# Patient Record
Sex: Male | Born: 1975 | Race: White | Hispanic: No | Marital: Married | Smoking: Current every day smoker
Health system: Southern US, Community
[De-identification: ages and names within clinical notes are randomized; demographics above are authoritative.]

## PROBLEM LIST (undated history)

## (undated) DIAGNOSIS — G4730 Sleep apnea, unspecified: Secondary | ICD-10-CM

## (undated) DIAGNOSIS — K219 Gastro-esophageal reflux disease without esophagitis: Secondary | ICD-10-CM

## (undated) HISTORY — DX: Gastro-esophageal reflux disease without esophagitis: K21.9

## (undated) HISTORY — DX: Sleep apnea, unspecified: G47.30

---

## 1998-07-16 HISTORY — PX: WISDOM TOOTH EXTRACTION: SHX21

## 2018-07-16 DIAGNOSIS — K922 Gastrointestinal hemorrhage, unspecified: Secondary | ICD-10-CM

## 2018-07-16 HISTORY — DX: Gastrointestinal hemorrhage, unspecified: K92.2

## 2020-05-20 ENCOUNTER — Other Ambulatory Visit: Payer: Self-pay | Admitting: Family Medicine

## 2020-05-20 ENCOUNTER — Ambulatory Visit
Admission: RE | Admit: 2020-05-20 | Discharge: 2020-05-20 | Disposition: A | Discharge status: Home / Self Care | Payer: BC Managed Care – PPO | Attending: Family Medicine | Admitting: Family Medicine

## 2020-05-20 ENCOUNTER — Other Ambulatory Visit: Payer: Self-pay

## 2020-05-20 ENCOUNTER — Ambulatory Visit
Admission: RE | Admit: 2020-05-20 | Discharge: 2020-05-20 | Disposition: A | Discharge status: Home / Self Care | Payer: BC Managed Care – PPO | Source: Ambulatory Visit | Attending: Family Medicine | Admitting: Family Medicine

## 2020-05-20 DIAGNOSIS — M545 Low back pain, unspecified: Secondary | ICD-10-CM | POA: Insufficient documentation

## 2020-05-20 DIAGNOSIS — M546 Pain in thoracic spine: Secondary | ICD-10-CM

## 2020-06-17 ENCOUNTER — Encounter: Payer: Self-pay | Admitting: Urology

## 2020-06-17 ENCOUNTER — Other Ambulatory Visit: Payer: Self-pay

## 2020-06-17 ENCOUNTER — Ambulatory Visit: Payer: BC Managed Care – PPO | Admitting: Urology

## 2020-06-17 VITALS — BP 137/84 | HR 73 | Ht 68.0 in | Wt 230.0 lb

## 2020-06-17 DIAGNOSIS — N5201 Erectile dysfunction due to arterial insufficiency: Secondary | ICD-10-CM | POA: Diagnosis not present

## 2020-06-17 DIAGNOSIS — R6882 Decreased libido: Secondary | ICD-10-CM | POA: Diagnosis not present

## 2020-06-17 MED ORDER — TADALAFIL 20 MG PO TABS: ORAL_TABLET | ORAL | 0 refills | Status: DC

## 2020-06-17 NOTE — Progress Notes (Signed)
   06/17/2020 8:43 AM   Gabriel Young 1975/11/06 [id number]  Referring provider: Alveta Heimlich, FNP [address] [place],  [place] [address]  Chief Complaint  Patient presents with  . Erectile Dysfunction    HPI: Gabriel Young is a 44 y.o. male seen at request of Collie Siad, FNP for evaluation of erectile dysfunction.   5 year history of the ED  States symptoms started after being placed on Lexapro for depression 6 years ago; after taking medication for several months he had loss of interest in sex and had erectile dysfunction  Has been off the medication for 3 years and currently not on any SSRI meds  Has no interest in sexual activity  Has partial erections which are not firm enough for penetration and difficulty maintaining  No prior evaluation or treatment  No pain or curvature with erections  No significant erections with early morning awakening or middle of the night  Risk factors-current smoker 1 pack/day x 20 years  PMH: History reviewed. No pertinent past medical history.  Surgical History: History reviewed. No pertinent surgical history.  Home Medications:  Allergies as of 06/17/2020      Reactions   Penicillins Other (See Comments)      Medication List       Accurate as of June 17, 2020  8:43 AM. If you have any questions, ask your nurse or doctor.        diclofenac 75 MG EC tablet Commonly known as: VOLTAREN diclofenac sodium 75 mg tablet,delayed release  Take 1 tablet twice a day by oral route with meals for 15 days.   gabapentin 400 MG capsule Commonly known as: NEURONTIN gabapentin 400 mg capsule  Take 1 capsule every day by oral route at bedtime for 15 days.   Lidoderm 5 % Generic drug: lidocaine Lidoderm 5 % topical patch  APPLY 1 PATCH BY TOPICAL ROUTE ONCE DAILY (MAY WEAR UP TO 12HOURS.)       Allergies:  Allergies  Allergen Reactions  . Penicillins Other (See Comments)    Family History: History reviewed. No pertinent  family history.  Social History:  reports that he has been smoking. He has never used smokeless tobacco. He reports current alcohol use. No history on file for drug use.   Physical Exam: BP 137/84   Pulse 73   Ht 5\' 8"  (1.727 m)   Wt 230 lb (104.3 kg)   BMI 34.97 kg/m   Constitutional:  Alert and oriented, No acute distress. HEENT: Rachel AT, moist mucus membranes.  Trachea midline, no masses. Cardiovascular: No clubbing, cyanosis, or edema. Respiratory: Normal respiratory effort, no increased work of breathing. GI: Abdomen is soft, nontender, nondistended, no abdominal masses GU: Phallus circumcised without lesions, testes descended bilateral without masses or tenderness; estimated volume 15 cc bilaterally Skin: No rashes, bruises or suspicious lesions. Neurologic: Grossly intact, no focal deficits, moving all 4 extremities. Psychiatric: Normal mood and affect.   Assessment & Plan:    1.  Erectile dysfunction  Severe ED  Tobacco history most significant organic risk factor  Trial tadalafil 20 mg 1 hour prior to intercourse; instructed to try on at least 5 occasions before determining failure  2.  Low libido  Testosterone/LH drawn this morning  Will call with results   , MD  [facility] [address] [place], [place] [phone]

## 2020-06-18 LAB — LUTEINIZING HORMONE: LH: 5.8 m[IU]/mL (ref 1.7–8.6)

## 2020-06-18 LAB — TESTOSTERONE: Testosterone: 433 ng/dL (ref 264–916)

## 2020-06-20 ENCOUNTER — Telehealth: Payer: Self-pay | Admitting: *Deleted

## 2020-06-20 NOTE — Telephone Encounter (Signed)
-----   Message from Riki Altes, MD sent at 06/19/2020 10:57 AM EST ----- Testosterone level was normal

## 2020-06-20 NOTE — Telephone Encounter (Signed)
Notified patient as instructed, patient pleased °

## 2020-07-29 DIAGNOSIS — G5603 Carpal tunnel syndrome, bilateral upper limbs: Secondary | ICD-10-CM | POA: Diagnosis not present

## 2020-07-29 DIAGNOSIS — M545 Low back pain, unspecified: Secondary | ICD-10-CM | POA: Diagnosis not present

## 2020-08-13 DIAGNOSIS — M545 Low back pain, unspecified: Secondary | ICD-10-CM | POA: Diagnosis not present

## 2020-08-31 DIAGNOSIS — M545 Low back pain, unspecified: Secondary | ICD-10-CM | POA: Diagnosis not present

## 2020-09-02 DIAGNOSIS — M5416 Radiculopathy, lumbar region: Secondary | ICD-10-CM | POA: Diagnosis not present

## 2020-09-23 DIAGNOSIS — R252 Cramp and spasm: Secondary | ICD-10-CM | POA: Diagnosis not present

## 2020-09-23 DIAGNOSIS — M545 Low back pain, unspecified: Secondary | ICD-10-CM | POA: Diagnosis not present

## 2020-09-23 DIAGNOSIS — D72829 Elevated white blood cell count, unspecified: Secondary | ICD-10-CM | POA: Diagnosis not present

## 2020-09-23 DIAGNOSIS — G8929 Other chronic pain: Secondary | ICD-10-CM | POA: Diagnosis not present

## 2020-09-30 DIAGNOSIS — G8929 Other chronic pain: Secondary | ICD-10-CM | POA: Diagnosis not present

## 2020-09-30 DIAGNOSIS — M545 Low back pain, unspecified: Secondary | ICD-10-CM | POA: Diagnosis not present

## 2020-09-30 DIAGNOSIS — D72829 Elevated white blood cell count, unspecified: Secondary | ICD-10-CM | POA: Diagnosis not present

## 2020-09-30 DIAGNOSIS — R252 Cramp and spasm: Secondary | ICD-10-CM | POA: Diagnosis not present

## 2020-10-21 ENCOUNTER — Other Ambulatory Visit: Payer: Self-pay

## 2020-10-21 ENCOUNTER — Ambulatory Visit: Payer: BC Managed Care – PPO | Admitting: Infectious Disease

## 2020-10-21 ENCOUNTER — Encounter: Payer: Self-pay | Admitting: Infectious Disease

## 2020-10-21 VITALS — BP 135/75 | HR 73 | Temp 97.9°F | Wt 231.0 lb

## 2020-10-21 DIAGNOSIS — Z114 Encounter for screening for human immunodeficiency virus [HIV]: Secondary | ICD-10-CM

## 2020-10-21 DIAGNOSIS — R252 Cramp and spasm: Secondary | ICD-10-CM

## 2020-10-21 DIAGNOSIS — M791 Myalgia, unspecified site: Secondary | ICD-10-CM

## 2020-10-21 DIAGNOSIS — B2790 Infectious mononucleosis, unspecified without complication: Secondary | ICD-10-CM | POA: Diagnosis not present

## 2020-10-21 DIAGNOSIS — K922 Gastrointestinal hemorrhage, unspecified: Secondary | ICD-10-CM

## 2020-10-21 DIAGNOSIS — K274 Chronic or unspecified peptic ulcer, site unspecified, with hemorrhage: Secondary | ICD-10-CM

## 2020-10-21 DIAGNOSIS — M5442 Lumbago with sciatica, left side: Secondary | ICD-10-CM

## 2020-10-21 DIAGNOSIS — N529 Male erectile dysfunction, unspecified: Secondary | ICD-10-CM

## 2020-10-21 DIAGNOSIS — F17200 Nicotine dependence, unspecified, uncomplicated: Secondary | ICD-10-CM

## 2020-10-21 DIAGNOSIS — M5441 Lumbago with sciatica, right side: Secondary | ICD-10-CM

## 2020-10-21 DIAGNOSIS — M62838 Other muscle spasm: Secondary | ICD-10-CM

## 2020-10-21 DIAGNOSIS — M545 Low back pain, unspecified: Secondary | ICD-10-CM

## 2020-10-21 DIAGNOSIS — G8929 Other chronic pain: Secondary | ICD-10-CM

## 2020-10-21 HISTORY — DX: Other muscle spasm: M62.838

## 2020-10-21 HISTORY — DX: Low back pain, unspecified: M54.50

## 2020-10-21 HISTORY — DX: Cramp and spasm: R25.2

## 2020-10-21 HISTORY — DX: Gastrointestinal hemorrhage, unspecified: K92.2

## 2020-10-21 HISTORY — DX: Male erectile dysfunction, unspecified: N52.9

## 2020-10-21 HISTORY — DX: Infectious mononucleosis, unspecified without complication: B27.90

## 2020-10-21 HISTORY — DX: Myalgia, unspecified site: M79.10

## 2020-10-21 HISTORY — DX: Nicotine dependence, unspecified, uncomplicated: F17.200

## 2020-10-21 MED ORDER — PREDNISONE 20 MG PO TABS: 40.0000 mg | ORAL_TABLET | Freq: Every day | ORAL | 0 refills | Status: AC

## 2020-10-21 NOTE — Progress Notes (Signed)
Subjective:  Reason for infectious disease consult: Positive Epstein-Barr viral capsid IgG  Referring Physician: Collie Siad, NP   Patient ID: Gabriel Young, male    DOB: [birth year], 45 y.o.   MRN: [id number] Chief complaint: diffuse muscle pains HPI  Gabriel Young is a 45 year old previously had been largely healthy other than history of smoking who this past year has developed severe pain in his upper and mid and low back as well as in multiple muscles throughout his body along with severe cramping.  He has been followed at the [place] (where he works[facility] by Collie Siad NP.   Back pain but has been evaluated by plain films and an MRI and he has been evaluated by orthopedics with [facility] and also had physical therapy but he had difficulty participating in physical therapy due to the severity of his muscle cramps.  His pain and cramping is not responded to symptomatic treatment so far.  He was given low-dose prednisone 10 mg without much relief.  He has seen my former partner Dr. Sampson Goon at the [facility] as well.  His primary care physician ordered an Epstein-Barr virus antibody on him.  I believe this may have been to work-up possible myositis.  The patient himself tells me that there was concerned about ultimately sclerosis.  He does also endorse some problems with his vision.  Has had hep B and hep C antibodies tested there were also negative but not HIV testing.  I told him that having a high IgG level to Epstein-Barr was not a concern but would be more consistent with prior infection with this virus that stays with [place] for our entire lives. The level of this antibody does not at all indicate recent infection.       No past medical history on file.  No past surgical history on file.  No family history on file.    Social History   Socioeconomic History  . Marital status: Single    Spouse name: Not on file  . Number of children: Not on  file  . Years of education: Not on file  . Highest education level: Not on file  Occupational History  . Not on file  Tobacco Use  . Smoking status: Current Every Day Smoker  . Smokeless tobacco: Never Used  Substance and Sexual Activity  . Alcohol use: Yes  . Drug use: Not on file  . Sexual activity: Not on file  Other Topics Concern  . Not on file  Social History Narrative  . Not on file   Social Determinants of Health   Financial Resource Strain: Not on file  Food Insecurity: Not on file  Transportation Needs: Not on file  Physical Activity: Not on file  Stress: Not on file  Social Connections: Not on file    Allergies  Allergen Reactions  . Penicillins Other (See Comments)     Current Outpatient Medications:  .  diclofenac (VOLTAREN) 75 MG EC tablet, diclofenac sodium 75 mg tablet,delayed release  Take 1 tablet twice a day by oral route with meals for 15 days., Disp: , Rfl:  .  gabapentin (NEURONTIN) 400 MG capsule, gabapentin 400 mg capsule  Take 1 capsule every day by oral route at bedtime for 15 days., Disp: , Rfl:  .  lidocaine (LIDODERM) 5 %, Lidoderm 5 % topical patch  APPLY 1 PATCH BY TOPICAL ROUTE ONCE DAILY (MAY WEAR UP TO 12HOURS.), Disp: , Rfl:  .  tadalafil (CIALIS)  20 MG tablet, 1 tab 1 hour prior to intercourse, Disp: 10 tablet, Rfl: 0  Review of Systems  Constitutional: Positive for fatigue. Negative for activity change, appetite change, chills, diaphoresis, fever and unexpected weight change.  HENT: Negative for congestion, rhinorrhea, sinus pressure, sneezing, sore throat and trouble swallowing.   Eyes: Negative for photophobia and visual disturbance.  Respiratory: Negative for cough, chest tightness, shortness of breath, wheezing and stridor.   Cardiovascular: Negative for chest pain, palpitations and leg swelling.  Gastrointestinal: Negative for abdominal distention, abdominal pain, anal bleeding, blood in stool, constipation, diarrhea, nausea and  vomiting.  Genitourinary: Negative for difficulty urinating, dysuria, flank pain and hematuria.  Musculoskeletal: Positive for arthralgias, back pain and myalgias. Negative for gait problem and joint swelling.  Skin: Negative for color change, pallor, rash and wound.  Neurological: Negative for dizziness, tremors, weakness and light-headedness.  Hematological: Negative for adenopathy. Does not bruise/bleed easily.  Psychiatric/Behavioral: Positive for dysphoric mood. Negative for agitation, behavioral problems, confusion, decreased concentration and sleep disturbance.       Objective:   Physical Exam Constitutional:      Appearance: He is well-developed.  HENT:     Head: Normocephalic and atraumatic.  Eyes:     Conjunctiva/sclera: Conjunctivae normal.  Cardiovascular:     Rate and Rhythm: Normal rate and regular rhythm.  Pulmonary:     Effort: Pulmonary effort is normal. No respiratory distress.     Breath sounds: No wheezing.  Abdominal:     General: There is no distension.     Palpations: Abdomen is soft.  Musculoskeletal:        General: Normal range of motion.     Right upper arm: Tenderness present.     Left upper arm: Tenderness present.     Right forearm: Tenderness present.     Left forearm: Tenderness present.     Cervical back: Normal range of motion and neck supple. Spasms, tenderness and bony tenderness present. No swelling, edema or deformity. Normal range of motion.     Thoracic back: Tenderness and bony tenderness present. No swelling, edema or signs of trauma.     Lumbar back: Tenderness present. No swelling, edema, deformity or lacerations. Normal range of motion.     Right upper leg: Tenderness present.     Left upper leg: Tenderness present.  Skin:    General: Skin is warm and dry.     Coloration: Skin is not pale.     Findings: No erythema or rash.  Neurological:     Mental Status: He is alert and oriented to person, place, and time.            Assessment & Plan:   Diffuse muscle pains including upper back lower back arms legs:  Do not think this is an infectious myositis his hep B and hep C antibodies were negative we will check for HIV.  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  Will check CMV IgG and IgM though I doubt very much that he has active CMV infection either.  I will send labs for autoimmune myositis including ANA rheumatoid factor aldolase LDH ANCA   I have also recommended [facility] for him  I will give him a 10-day course of for prednisone 40 mg to see if this provides any relief.  He did have a history of an upper GI bleed many years ago and I did inform him that there was some risk of precipitating GI bleed  with a corticosteroid but he was willing to go forward with a trial of steroids.    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 I did not see much point in checking a full antibody panel for this as this seems highly unlikely but we could have out of thoroughness and certainly a full antibody panel could be done by another provider, my vision for viral myositis is next to 0.  Visual problems should consider referral to ophthalmology  I spent greater than 60 minutes with the patient including greater than 50% of time in face to face counsel of the patient work-up of the patient's myositis significance of his Epstein-Barr virus antibody reviewing medical records including pertinent laboratory and radiographic data which I personally reviewed including that found in our own EMR and in coordination of his care

## 2020-10-25 LAB — ANTI-NUCLEAR AB-TITER (ANA TITER): ANA Titer 1: 1:40 {titer} — ABNORMAL HIGH

## 2020-10-25 LAB — CMV IGM: CMV IgM: <30 AU/mL

## 2020-10-25 LAB — CK: Total CK: 132 U/L (ref 44–196)

## 2020-10-25 LAB — PAN-ANCA
ANCA Screen: NEGATIVE
Myeloperoxidase Abs: <1 AI
Serine Protease 3: <1 AI

## 2020-10-25 LAB — C-REACTIVE PROTEIN: CRP: 2 mg/L (ref <8.0)

## 2020-10-25 LAB — CYTOMEGALOVIRUS ANTIBODY, IGG: Cytomegalovirus Ab-IgG: <0.6 U/mL

## 2020-10-25 LAB — SEDIMENTATION RATE: Sed Rate: 11 mm/h (ref 0–15)

## 2020-10-25 LAB — HIV ANTIBODY (ROUTINE TESTING W REFLEX): HIV 1&2 Ab, 4th Generation: NONREACTIVE

## 2020-10-25 LAB — ANA: Anti Nuclear Antibody (ANA): POSITIVE — AB

## 2020-10-25 LAB — RHEUMATOID FACTOR: Rheumatoid fact SerPl-aCnc: <14 IU/mL (ref <14)

## 2020-10-25 LAB — ALDOLASE: Aldolase: 5.7 U/L (ref <=8.1)

## 2020-10-25 LAB — TSH: TSH: 1.09 mIU/L (ref 0.40–4.50)

## 2020-11-04 DIAGNOSIS — R768 Other specified abnormal immunological findings in serum: Secondary | ICD-10-CM | POA: Diagnosis not present

## 2020-11-04 DIAGNOSIS — M791 Myalgia, unspecified site: Secondary | ICD-10-CM | POA: Diagnosis not present

## 2020-11-04 DIAGNOSIS — R252 Cramp and spasm: Secondary | ICD-10-CM | POA: Diagnosis not present

## 2020-11-04 DIAGNOSIS — M359 Systemic involvement of connective tissue, unspecified: Secondary | ICD-10-CM | POA: Diagnosis not present

## 2020-11-11 DIAGNOSIS — M5416 Radiculopathy, lumbar region: Secondary | ICD-10-CM | POA: Diagnosis not present

## 2020-11-25 ENCOUNTER — Telehealth: Payer: Self-pay

## 2020-11-25 DIAGNOSIS — M5416 Radiculopathy, lumbar region: Secondary | ICD-10-CM | POA: Diagnosis not present

## 2020-11-25 DIAGNOSIS — M4316 Spondylolisthesis, lumbar region: Secondary | ICD-10-CM | POA: Diagnosis not present

## 2020-11-25 DIAGNOSIS — Z6833 Body mass index (BMI) 33.0-33.9, adult: Secondary | ICD-10-CM | POA: Diagnosis not present

## 2020-11-25 DIAGNOSIS — R030 Elevated blood-pressure reading, without diagnosis of hypertension: Secondary | ICD-10-CM | POA: Diagnosis not present

## 2020-11-25 NOTE — Telephone Encounter (Signed)
Yes thanks Aundra Millet we never identified an infectious disease in this gentleman as far as a cause of his symptoms

## 2020-11-25 NOTE — Telephone Encounter (Signed)
Called patient, advised him no infectious cause was identified and encouraged him to follow up with rheumatology. Patient verbalized understanding and has no further questions.   Sandie Ano, RN

## 2020-11-25 NOTE — Telephone Encounter (Signed)
Patient called concerned about reactive ANA test. RN advised him to follow up with rheumatology regarding this lab result. He states he saw rheumatology but that they "did not do anything" for him. He complains of more leg cramping and is wondering if he needs further follow up with infectious disease, states he will again reach out to rheumatology.   Sandie Ano, RN

## 2021-01-06 DIAGNOSIS — G8929 Other chronic pain: Secondary | ICD-10-CM | POA: Diagnosis not present

## 2021-01-06 DIAGNOSIS — D72829 Elevated white blood cell count, unspecified: Secondary | ICD-10-CM | POA: Diagnosis not present

## 2021-01-06 DIAGNOSIS — R252 Cramp and spasm: Secondary | ICD-10-CM | POA: Diagnosis not present

## 2021-01-06 DIAGNOSIS — G894 Chronic pain syndrome: Secondary | ICD-10-CM | POA: Diagnosis not present

## 2021-01-06 DIAGNOSIS — M545 Low back pain, unspecified: Secondary | ICD-10-CM | POA: Diagnosis not present

## 2021-04-07 DIAGNOSIS — Z0000 Encounter for general adult medical examination without abnormal findings: Secondary | ICD-10-CM | POA: Diagnosis not present

## 2021-04-07 DIAGNOSIS — R0683 Snoring: Secondary | ICD-10-CM | POA: Diagnosis not present

## 2021-04-07 DIAGNOSIS — G894 Chronic pain syndrome: Secondary | ICD-10-CM | POA: Diagnosis not present

## 2021-04-07 DIAGNOSIS — M545 Low back pain, unspecified: Secondary | ICD-10-CM | POA: Diagnosis not present

## 2021-04-07 DIAGNOSIS — R252 Cramp and spasm: Secondary | ICD-10-CM | POA: Diagnosis not present

## 2021-06-23 DIAGNOSIS — R0683 Snoring: Secondary | ICD-10-CM | POA: Diagnosis not present

## 2021-06-28 DIAGNOSIS — G4733 Obstructive sleep apnea (adult) (pediatric): Secondary | ICD-10-CM | POA: Diagnosis not present

## 2021-08-04 DIAGNOSIS — R252 Cramp and spasm: Secondary | ICD-10-CM | POA: Diagnosis not present

## 2021-08-04 DIAGNOSIS — R202 Paresthesia of skin: Secondary | ICD-10-CM | POA: Diagnosis not present

## 2021-08-04 DIAGNOSIS — M359 Systemic involvement of connective tissue, unspecified: Secondary | ICD-10-CM | POA: Insufficient documentation

## 2021-08-04 DIAGNOSIS — D649 Anemia, unspecified: Secondary | ICD-10-CM | POA: Diagnosis not present

## 2021-08-04 DIAGNOSIS — G894 Chronic pain syndrome: Secondary | ICD-10-CM | POA: Diagnosis not present

## 2021-08-04 DIAGNOSIS — Z9989 Dependence on other enabling machines and devices: Secondary | ICD-10-CM | POA: Diagnosis not present

## 2021-08-04 DIAGNOSIS — G4733 Obstructive sleep apnea (adult) (pediatric): Secondary | ICD-10-CM | POA: Diagnosis not present

## 2021-08-04 DIAGNOSIS — M5430 Sciatica, unspecified side: Secondary | ICD-10-CM | POA: Insufficient documentation

## 2021-08-18 DIAGNOSIS — G4733 Obstructive sleep apnea (adult) (pediatric): Secondary | ICD-10-CM | POA: Diagnosis not present

## 2021-09-15 DIAGNOSIS — G4733 Obstructive sleep apnea (adult) (pediatric): Secondary | ICD-10-CM | POA: Diagnosis not present

## 2021-10-06 ENCOUNTER — Ambulatory Visit
Admission: RE | Admit: 2021-10-06 | Discharge: 2021-10-06 | Disposition: A | Discharge status: Home / Self Care | Payer: BC Managed Care – PPO | Source: Ambulatory Visit | Attending: Family Medicine | Admitting: Family Medicine

## 2021-10-06 ENCOUNTER — Other Ambulatory Visit: Payer: Self-pay | Admitting: Family Medicine

## 2021-10-06 ENCOUNTER — Ambulatory Visit
Admission: RE | Admit: 2021-10-06 | Discharge: 2021-10-06 | Disposition: A | Discharge status: Home / Self Care | Payer: BC Managed Care – PPO | Attending: Family Medicine | Admitting: Family Medicine

## 2021-10-06 ENCOUNTER — Other Ambulatory Visit: Payer: Self-pay

## 2021-10-06 ENCOUNTER — Encounter: Payer: Self-pay | Admitting: Family Medicine

## 2021-10-06 ENCOUNTER — Ambulatory Visit: Payer: BC Managed Care – PPO | Admitting: Family Medicine

## 2021-10-06 VITALS — BP 120/83 | HR 83 | Ht 68.0 in | Wt 222.0 lb

## 2021-10-06 DIAGNOSIS — M545 Low back pain, unspecified: Secondary | ICD-10-CM | POA: Insufficient documentation

## 2021-10-06 DIAGNOSIS — M4307 Spondylolysis, lumbosacral region: Secondary | ICD-10-CM

## 2021-10-06 DIAGNOSIS — M4727 Other spondylosis with radiculopathy, lumbosacral region: Secondary | ICD-10-CM

## 2021-10-06 DIAGNOSIS — M4306 Spondylolysis, lumbar region: Secondary | ICD-10-CM | POA: Insufficient documentation

## 2021-10-06 DIAGNOSIS — M79604 Pain in right leg: Secondary | ICD-10-CM | POA: Insufficient documentation

## 2021-10-06 DIAGNOSIS — M2578 Osteophyte, vertebrae: Secondary | ICD-10-CM | POA: Diagnosis not present

## 2021-10-06 MED ORDER — TIZANIDINE HCL 4 MG PO TABS: 4.0000 mg | ORAL_TABLET | Freq: Every evening | ORAL | 0 refills | Status: DC | PRN

## 2021-10-06 MED ORDER — CELECOXIB 200 MG PO CAPS: 200.0000 mg | ORAL_CAPSULE | Freq: Two times a day (BID) | ORAL | 0 refills | Status: DC | PRN

## 2021-10-06 NOTE — Assessment & Plan Note (Signed)
Patient with longstanding (several years plus) history of low back pain with severe worsening a few years prior, atraumatic in onset, progressive.  Has previously seen outside orthopedic group who ordered x-rays, MRI, has been through meloxicam, diclofenac, at least 8 weeks of physical therapy, lumbosacral corticosteroid injection unspecified, and has been referred to neurosurgery who advised that their only intervention would be surgical.   ? ?His current symptoms are throughout the day with progressive worsening with prolonged weightbearing, activity, motion after period of immobility, has paresthesias into the left lower leg to the feet, sporadic muscle cramps in bilateral legs and low back.  He did obtain new x-rays today for review. He has secondary pain to the groin bilaterally. ? ?Examination reveals limited range of motion at the low back and bilateral hips due to pain and stiffness, positive straight leg raise on the left, positive FADIR, equivocal FABER, positive Kemps test localizing to the left lower back, contralateral with positive FADIR, otherwise negative, he does have cramping with resisted straight leg raise bilaterally. ? ?We discussed various surgical and nonsurgical treatment strategies given his evidence of spondylolysis, facet hypertrophy, and left-sided radiculopathy given his treatments to date.  He is wishing to proceed in a nonsurgical manner, in that regard have advised interim trial of alternate NSAID, initiation of skeletal muscle relaxer, and a referral to pain and spine for interventional spine management options, additionally pharmacotherapeutic options if indicated.  Patient is amenable to physical therapy once symptoms are adequately controlled, this is reasonable.  New MRI ordered of the lumbar spine as previous MRI has been greater than 1 year prior and is necessary for procedural planning. ? ?We can coordinate with pain/spine group for concomitant psoas tendon insertional  injections in addition to spine procedures prior to physical therapy. ? ?Chronic condition, symptomatic, Rx management ?

## 2021-10-06 NOTE — Assessment & Plan Note (Signed)
See additional assessment(s) for plan details. 

## 2021-10-06 NOTE — Patient Instructions (Signed)
-   Transition meloxicam to Celebrex, can dose twice daily on as-needed basis ?- Can trial nightly tizanidine (muscle relaxer) ?- Referral coordinator will contact you in regards to scheduling MRI and follow-up with pain/spine ?- Return for follow-up in 2 months for establishment of primary care visit ?- Contact us for any questions between now and then ?

## 2021-10-06 NOTE — Progress Notes (Signed)
?  ? ?Primary Care / Sports Medicine Office Visit ? ?Patient Information:  ?Patient ID: Gabriel Young, male DOB: 12-22-1975 Age: 46 y.o. MRN: [id number]  ? ?Gabriel Young is a pleasant 46 y.o. male presenting with the following: ? ?Chief Complaint  ?Patient presents with  ? Back Pain  ?  Lower back pain for 2-3 years. Radiate down left leg to foot. Sometimes in right. No known injury. [facility] did MRI 1 year ago was told broke bone in back.   ? ? ?Vitals:  ? 10/06/21 1338  ?BP: 120/83  ?Pulse: 83  ?SpO2: 98%  ? ?Vitals:  ? 10/06/21 1338  ?Weight: 222 lb (100.7 kg)  ?Height: 5\' 8"  (1.727 m)  ? ?Body mass index is 33.75 kg/m?. ? ?No results found.  ? ?Independent interpretation of notes and tests performed by another provider:  ? ?Independent interpretation of lumbar spine x-rays dated 10/06/2021 reveals subtle scoliotic curvature on AP view, lateral view with anterolisthesis of L5 on S1, lucency at the pars at the same level consistent with spondylolysis, there is facet hypertrophy consistent with spondylosis throughout the lower lumbar spine, bilateral mild foraminal narrowing noted, no acute osseous process noted ? ?Procedures performed:  ? ?None ? ?Pertinent History, Exam, Impression, and Recommendations:  ? ?Multilevel lumbosacral spondylosis with radiculopathy ?Patient with longstanding (several years plus) history of low back pain with severe worsening a few years prior, atraumatic in onset, progressive.  Has previously seen outside orthopedic group who ordered x-rays, MRI, has been through meloxicam, diclofenac, at least 8 weeks of physical therapy, lumbosacral corticosteroid injection unspecified, and has been referred to neurosurgery who advised that their only intervention would be surgical.   ? ?His current symptoms are throughout the day with progressive worsening with prolonged weightbearing, activity, motion after period of immobility, has paresthesias into the left lower leg to the feet, sporadic muscle  cramps in bilateral legs and low back.  He did obtain new x-rays today for review. He has secondary pain to the groin bilaterally. ? ?Examination reveals limited range of motion at the low back and bilateral hips due to pain and stiffness, positive straight leg raise on the left, positive FADIR, equivocal FABER, positive Kemps test localizing to the left lower back, contralateral with positive FADIR, otherwise negative, he does have cramping with resisted straight leg raise bilaterally. ? ?We discussed various surgical and nonsurgical treatment strategies given his evidence of spondylolysis, facet hypertrophy, and left-sided radiculopathy given his treatments to date.  He is wishing to proceed in a nonsurgical manner, in that regard have advised interim trial of alternate NSAID, initiation of skeletal muscle relaxer, and a referral to pain and spine for interventional spine management options, additionally pharmacotherapeutic options if indicated.  Patient is amenable to physical therapy once symptoms are adequately controlled, this is reasonable.  New MRI ordered of the lumbar spine as previous MRI has been greater than 1 year prior and is necessary for procedural planning. ? ?We can coordinate with pain/spine group for concomitant psoas tendon insertional injections in addition to spine procedures prior to physical therapy. ? ?Chronic condition, symptomatic, Rx management ? ?Spondylolysis, lumbosacral ?See additional assessment(s) for plan details.  ? ?Orders & Medications ?Meds ordered this encounter  ?Medications  ? tiZANidine (ZANAFLEX) 4 MG tablet  ?  Sig: Take 1 tablet (4 mg total) by mouth at bedtime as needed for muscle spasms.  ?  Dispense:  30 tablet  ?  Refill:  0  ? celecoxib (CELEBREX) 200 MG  capsule  ?  Sig: Take 1 capsule (200 mg total) by mouth 2 (two) times daily as needed. One to 2 tablets by mouth daily as needed for pain.  ?  Dispense:  60 capsule  ?  Refill:  0  ? ?Orders Placed This Encounter   ?Procedures  ? MR Lumbar Spine Wo Contrast  ? Ambulatory referral to Anesthesiology  ?  ? ?Return in about 2 months (around 12/06/2021) for Est care Primary Care.  ?  ? ?Jerrol Banana, MD ? ? Primary Care Sports Medicine ?[facility] ?[facility]  ? ?

## 2021-10-09 NOTE — Telephone Encounter (Signed)
Requested medications are due for refill today.  no ? ?Requested medications are on the active medications list.  yes ? ?Last refill. 10/06/2021 #60 0 refills ? ?Future visit scheduled.   yes ? ?Notes to clinic.  Medication refilled 10/06/2021. ? ? ? ?Requested Prescriptions  ?Pending Prescriptions Disp Refills  ? celecoxib (CELEBREX) 200 MG capsule [Pharmacy Med Name: CELECOXIB 200 MG CAPSULE] 60 capsule 0  ?  Sig: TAKE 1 CAP BY MOUTH 2 TIMES DAILY AS NEEDED. ONE TO 2 TABLETS BY MOUTH DAILY AS NEEDED FOR PAIN.  ?  ? Analgesics:  COX2 Inhibitors Failed - 10/06/2021  3:22 PM  ?  ?  Failed - Manual Review: Labs are only required if the patient has taken medication for more than 8 weeks.  ?  ?  Failed - HGB in normal range and within 360 days  ?  No results found for: HGB, HGBKUC, HGBPOCKUC, HGBOTHER, TOTHGB, HGBPLASMA  ?  ?  ?  Failed - Cr in normal range and within 360 days  ?  No results found for: CREATININE, LABCREAU, LABCREA, POCCRE  ?  ?  ?  Failed - HCT in normal range and within 360 days  ?  No results found for: HCT, HCTKUC, SRHCT  ?  ?  ?  Failed - AST in normal range and within 360 days  ?  No results found for: POCAST, AST  ?  ?  ?  Failed - ALT in normal range and within 360 days  ?  No results found for: ALT, LABALT, POCALT  ?  ?  ?  Failed - eGFR is 30 or above and within 360 days  ?  No results found for: GFRAA, GFRNONAA, GFR, EGFR  ?  ?  ?  Failed - Valid encounter within last 12 months  ?  Recent Outpatient Visits   ? ?      ? 3 days ago Spondylolysis, lumbosacral  ? [facility] Montel Culver, MD  ? ?  ?  ?Future Appointments   ? ?        ? In 2 months Zigmund Daniel, Earley Abide, MD [facility], [place]  ? ?  ? ?  ?  ?  Passed - Patient is not pregnant  ?  ?  ?  ?

## 2021-10-16 DIAGNOSIS — G4733 Obstructive sleep apnea (adult) (pediatric): Secondary | ICD-10-CM | POA: Diagnosis not present

## 2021-10-25 ENCOUNTER — Ambulatory Visit
Admission: RE | Admit: 2021-10-25 | Discharge: 2021-10-25 | Disposition: A | Discharge status: Home / Self Care | Payer: BC Managed Care – PPO | Source: Ambulatory Visit | Attending: Family Medicine | Admitting: Family Medicine

## 2021-10-25 DIAGNOSIS — M4307 Spondylolysis, lumbosacral region: Secondary | ICD-10-CM | POA: Insufficient documentation

## 2021-10-25 DIAGNOSIS — M545 Low back pain, unspecified: Secondary | ICD-10-CM | POA: Diagnosis not present

## 2021-10-26 NOTE — Progress Notes (Signed)
I have reached out to referral to check, they will reach out to pt.  ?

## 2021-11-06 ENCOUNTER — Other Ambulatory Visit: Payer: Self-pay | Admitting: Family Medicine

## 2021-11-06 DIAGNOSIS — M4307 Spondylolysis, lumbosacral region: Secondary | ICD-10-CM

## 2021-11-06 DIAGNOSIS — M4727 Other spondylosis with radiculopathy, lumbosacral region: Secondary | ICD-10-CM

## 2021-11-07 NOTE — Telephone Encounter (Signed)
Requested medication (s) are due for refill today: yes  ? ?Requested medication (s) are on the active medication list: yes ? ?Last refill:  10/06/21 #30 0 refills ? ?Future visit scheduled: yes in 1 month ? ?Notes to clinic:  not delegated per protocol. Do you want to refill Rx? ? ? ?  ?Requested Prescriptions  ?Pending Prescriptions Disp Refills  ? tiZANidine (ZANAFLEX) 4 MG tablet [Pharmacy Med Name: TIZANIDINE HCL 4 MG TABLET] 30 tablet 0  ?  Sig: TAKE 1 TABLET BY MOUTH AT BEDTIME AS NEEDED FOR MUSCLE SPASMS.  ?  ? Not Delegated - Cardiovascular:  Alpha-2 Agonists - tizanidine Failed - 11/06/2021  9:32 AM  ?  ?  Failed - This refill cannot be delegated  ?  ?  Failed - Valid encounter within last 6 months  ?  Recent Outpatient Visits   ? ?      ? 1 month ago Spondylolysis, lumbosacral  ? [facility] Jerrol Banana, MD  ? ?  ?  ?Future Appointments   ? ?        ? In 1 month Ashley Royalty Ocie Bob, MD [facility]  ? ?  ? ? ?  ?  ?  ? ?

## 2021-11-07 NOTE — Telephone Encounter (Signed)
Please advise 

## 2021-11-15 DIAGNOSIS — G4733 Obstructive sleep apnea (adult) (pediatric): Secondary | ICD-10-CM | POA: Diagnosis not present

## 2021-11-24 DIAGNOSIS — R252 Cramp and spasm: Secondary | ICD-10-CM | POA: Diagnosis not present

## 2021-11-24 DIAGNOSIS — G894 Chronic pain syndrome: Secondary | ICD-10-CM | POA: Diagnosis not present

## 2021-11-24 DIAGNOSIS — G4733 Obstructive sleep apnea (adult) (pediatric): Secondary | ICD-10-CM | POA: Diagnosis not present

## 2021-11-24 DIAGNOSIS — D649 Anemia, unspecified: Secondary | ICD-10-CM | POA: Diagnosis not present

## 2021-12-03 ENCOUNTER — Other Ambulatory Visit: Payer: Self-pay | Admitting: Family Medicine

## 2021-12-03 DIAGNOSIS — M4307 Spondylolysis, lumbosacral region: Secondary | ICD-10-CM

## 2021-12-03 DIAGNOSIS — M4727 Other spondylosis with radiculopathy, lumbosacral region: Secondary | ICD-10-CM

## 2021-12-06 ENCOUNTER — Other Ambulatory Visit: Payer: Self-pay | Admitting: Family Medicine

## 2021-12-06 DIAGNOSIS — M4727 Other spondylosis with radiculopathy, lumbosacral region: Secondary | ICD-10-CM

## 2021-12-06 DIAGNOSIS — M4307 Spondylolysis, lumbosacral region: Secondary | ICD-10-CM

## 2021-12-08 ENCOUNTER — Ambulatory Visit: Payer: BC Managed Care – PPO | Admitting: Family Medicine

## 2021-12-08 ENCOUNTER — Encounter: Payer: Self-pay | Admitting: Family Medicine

## 2021-12-08 VITALS — BP 130/86 | HR 59 | Ht 68.0 in | Wt 236.0 lb

## 2021-12-08 DIAGNOSIS — M4727 Other spondylosis with radiculopathy, lumbosacral region: Secondary | ICD-10-CM

## 2021-12-08 DIAGNOSIS — M4307 Spondylolysis, lumbosacral region: Secondary | ICD-10-CM

## 2021-12-08 NOTE — Assessment & Plan Note (Signed)
Patient presents for follow-up to chronic lumbosacral pain with progressive worsening.  At last visit on 10/06/2021 we ordered MRI lumbar spine, placed referral to pain and spine group.  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  Given that he has had limited response from prior corticosteroid injections to the spine, we did discuss surgical options as well and he is amenable to consultation with neurosurgeon for definitive management options.  Over the interim, given stated GI symptoms (bloating), I have advised him to limit Celebrex usage.  I have advised him to maintain visit with pain and spine once scheduled for additional interim management options.  I provided a total time of 44 minutes including both face-to-face and non-face-to-face time on 12/08/2021 inclusive of time utilized for medical chart review, information gathering, care coordination with staff, and documentation completion.  Specifically we reviewed MRI findings, correlating with patient's symptoms, treatments to date, potential additional treatments, and associated medical history that could be contributory.

## 2021-12-08 NOTE — Progress Notes (Signed)
     Primary Care / Sports Medicine Office Visit  Patient Information:  Patient ID: Gabriel Young, male DOB: [birth year] Age: 46 y.o. MRN: [id number]   [name] is a pleasant 45 y.o. male presenting with the following:  Chief Complaint  Patient presents with   Back Pain    Would like to touch basis about back.     Vitals:   12/08/21 0842  BP: 130/86  Pulse: (!) 59  SpO2: 99%   Vitals:   12/08/21 0842  Weight: 236 lb (107 kg)  Height: 5\' 8"  (1.727 m)   Body mass index is 35.88 kg/m.  No results found.   Independent interpretation of notes and tests performed by another provider:   None  Procedures performed:   None  Pertinent History, Exam, Impression, and Recommendations:   Problem List Items Addressed This Visit       Nervous and Auditory   Multilevel lumbosacral spondylosis with radiculopathy    Patient presents for follow-up to chronic lumbosacral pain with progressive worsening.  At last visit on 10/06/2021 we ordered MRI lumbar spine, placed referral to pain and spine group.  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  Given that he has had limited response from prior corticosteroid injections to the spine, we did discuss surgical options as well and he is amenable to consultation with neurosurgeon for definitive management options.  Over the interim, given stated GI symptoms (bloating), I have advised him to limit Celebrex usage.  I have advised him to maintain visit with pain and spine once scheduled for additional interim management options.  I provided a total time of 44 minutes including both face-to-face and non-face-to-face time on 12/08/2021 inclusive of time utilized for medical chart review, information gathering, care coordination with staff, and documentation completion.  Specifically we reviewed MRI findings, correlating with patient's symptoms, treatments to date, potential  additional treatments, and associated medical history that could be contributory.        Relevant Medications   meloxicam (MOBIC) 7.5 MG tablet   tiZANidine (ZANAFLEX) 4 MG tablet   DULoxetine (CYMBALTA) 60 MG capsule   Other Relevant Orders   Ambulatory referral to Spine Surgery     Musculoskeletal and Integument   Spondylolysis, lumbosacral - Primary   Relevant Orders   Ambulatory referral to Spine Surgery     Orders & Medications No orders of the defined types were placed in this encounter.  Orders Placed This Encounter  Procedures   Ambulatory referral to Spine Surgery     No follow-ups on file.     Montel Culver, MD   Primary Care Sports Medicine [place]

## 2021-12-08 NOTE — Telephone Encounter (Signed)
Requested medication (s) are due for refill today: yes  Requested medication (s) are on the active medication list: yes  Last refill:  10/06/21 #60  Future visit scheduled: was seen in office today - no future visit scheduled  Notes to clinic:  labs overdue    Requested Prescriptions  Pending Prescriptions Disp Refills   celecoxib (CELEBREX) 200 MG capsule [Pharmacy Med Name: CELECOXIB 200 MG CAPSULE] 60 capsule 0    Sig: TAKE 1 CAP BY MOUTH 2 TIMES DAILY AS NEEDED. ONE TO 2 TABLETS BY MOUTH DAILY AS NEEDED FOR PAIN.     Analgesics:  COX2 Inhibitors Failed - 12/06/2021  6:30 PM      Failed - Manual Review: Labs are only required if the patient has taken medication for more than 8 weeks.      Failed - HGB in normal range and within 360 days    No results found for: HGB, HGBKUC, HGBPOCKUC, HGBOTHER, TOTHGB, HGBPLASMA       Failed - Cr in normal range and within 360 days    No results found for: CREATININE, LABCREAU, LABCREA, POCCRE       Failed - HCT in normal range and within 360 days    No results found for: HCT, HCTKUC, SRHCT       Failed - AST in normal range and within 360 days    No results found for: POCAST, AST       Failed - ALT in normal range and within 360 days    No results found for: ALT, LABALT, POCALT       Failed - eGFR is 30 or above and within 360 days    No results found for: GFRAA, GFRNONAA, GFR, EGFR       Failed - Valid encounter within last 12 months    Recent Outpatient Visits           Today Spondylolysis, lumbosacral   [facility] Montel Culver, MD   2 months ago Spondylolysis, lumbosacral   [facility] Montel Culver, MD               Passed - Patient is not pregnant

## 2021-12-08 NOTE — Telephone Encounter (Signed)
Refill

## 2021-12-16 DIAGNOSIS — G4733 Obstructive sleep apnea (adult) (pediatric): Secondary | ICD-10-CM | POA: Diagnosis not present

## 2022-01-07 ENCOUNTER — Other Ambulatory Visit: Payer: Self-pay | Admitting: Family Medicine

## 2022-01-07 DIAGNOSIS — M4727 Other spondylosis with radiculopathy, lumbosacral region: Secondary | ICD-10-CM

## 2022-01-07 DIAGNOSIS — M4307 Spondylolysis, lumbosacral region: Secondary | ICD-10-CM

## 2022-01-15 DIAGNOSIS — G4733 Obstructive sleep apnea (adult) (pediatric): Secondary | ICD-10-CM | POA: Diagnosis not present

## 2022-01-22 DIAGNOSIS — G4733 Obstructive sleep apnea (adult) (pediatric): Secondary | ICD-10-CM | POA: Diagnosis not present

## 2022-02-09 ENCOUNTER — Other Ambulatory Visit: Payer: Self-pay | Admitting: Family Medicine

## 2022-02-09 ENCOUNTER — Telehealth: Payer: Self-pay | Admitting: Family Medicine

## 2022-02-09 DIAGNOSIS — M4307 Spondylolysis, lumbosacral region: Secondary | ICD-10-CM

## 2022-02-09 DIAGNOSIS — M4727 Other spondylosis with radiculopathy, lumbosacral region: Secondary | ICD-10-CM

## 2022-02-09 NOTE — Telephone Encounter (Signed)
Medication Refill - Medication:   Pt wife stated Dr.Jason Ashley Royalty has prescribed two medications, and pt has reached out to the pharmacy multiple times requesting refills but was advised Dr.Jason Ashley Royalty isn't filling medications. Pt wife is unsure of the medication names; she stated they are the only two Dr.Jason Ashley Royalty has prescribed.   Has the patient contacted their pharmacy? Yes.   (Agent: If no, request that the patient contact the pharmacy for the refill. If patient does not wish to contact the pharmacy document the reason why and proceed with request.) (Agent: If yes, when and what did the pharmacy advise?)  Preferred Pharmacy (with phone number or street name):  [facility] [phone] Dan Humphreys,  - [address]  [address] [place] [address]  Phone: [phone] Fax: [phone]  Hours: Not open 24 hours   Has the patient been seen for an appointment in the last year OR does the patient have an upcoming appointment? Yes.    Agent: Please be advised that RX refills may take up to 3 business days. We ask that you follow-up with your pharmacy.

## 2022-02-09 NOTE — Telephone Encounter (Signed)
Copied from CRM [id number]. Topic: General - Inquiry >> Feb 09, 2022  1:55 PM De Blanch wrote: Reason for CRM: Inetta Fermo, the patient's wife, stated that Dr.Matthews advised pt at his last visit on 12/08/2021 that he was not yet pt's primary care and that he needed to call back and schedule again new pt appointment.  Wife stated that Dr. Ashley Royalty said this is because pt had seen Dr. Sampson Goon, at [facility] and needed to go a few months without seeing Dr. Sampson Goon to establish care with him.   I reached out to the office and spoke with Marylene Land, who confirmed for me that pt does not need a new patient appointment.  However, pt wife insists that Dr. Ashley Royalty and his nurse said otherwise.  Please advise

## 2022-02-09 NOTE — Telephone Encounter (Signed)
Appointment made

## 2022-02-12 NOTE — Telephone Encounter (Signed)
Refill

## 2022-02-12 NOTE — Telephone Encounter (Signed)
Requested medication (s) are due for refill today: yes  Requested medication (s) are on the active medication list: yes  Last refill:  11/09/21 and 12/08/21  Future visit scheduled: yes   Notes to clinic:  Unable to refill per protocol, cannot delegate zanaflex.Unable to refill per protocol due to failed labs, no updated results for Celebrex.      Requested Prescriptions  Pending Prescriptions Disp Refills   tiZANidine (ZANAFLEX) 4 MG tablet 30 tablet 0     Not Delegated - Cardiovascular:  Alpha-2 Agonists - tizanidine Failed - 02/09/2022  2:49 PM      Failed - This refill cannot be delegated      Passed - Valid encounter within last 6 months    Recent Outpatient Visits           2 months ago Spondylolysis, lumbosacral   [facility] Montel Culver, MD   4 months ago Spondylolysis, lumbosacral   [facility] Montel Culver, MD       Future Appointments             In 2 months Zigmund Daniel Earley Abide, MD [facility]             celecoxib (CELEBREX) 200 MG capsule 60 capsule 0     Analgesics:  COX2 Inhibitors Failed - 02/09/2022  2:49 PM      Failed - Manual Review: Labs are only required if the patient has taken medication for more than 8 weeks.      Failed - HGB in normal range and within 360 days    No results found for: "HGB", "HGBKUC", "HGBPOCKUC", "HGBOTHER", "TOTHGB", "HGBPLASMA"       Failed - Cr in normal range and within 360 days    No results found for: "CREATININE", "LABCREAU", "LABCREA", "POCCRE"       Failed - HCT in normal range and within 360 days    No results found for: "HCT", "HCTKUC", "SRHCT"       Failed - AST in normal range and within 360 days    No results found for: "POCAST", "AST"       Failed - ALT in normal range and within 360 days    No results found for: "ALT", "LABALT", "POCALT"       Failed - eGFR is 30 or above and within 360 days    No results found for: "GFRAA", "GFRNONAA", "GFR", "EGFR"        Passed - Patient is not pregnant      Passed - Valid encounter within last 12 months    Recent Outpatient Visits           2 months ago Spondylolysis, lumbosacral   [facility] Montel Culver, MD   4 months ago Spondylolysis, lumbosacral   [facility] Montel Culver, MD       Future Appointments             In 2 months Zigmund Daniel, Earley Abide, MD [facility] [place]

## 2022-02-14 MED ORDER — TIZANIDINE HCL 4 MG PO TABS: ORAL_TABLET | ORAL | 0 refills | Status: DC

## 2022-02-14 MED ORDER — CELECOXIB 200 MG PO CAPS: ORAL_CAPSULE | ORAL | 0 refills | Status: AC

## 2022-02-15 DIAGNOSIS — G4733 Obstructive sleep apnea (adult) (pediatric): Secondary | ICD-10-CM | POA: Diagnosis not present

## 2022-02-22 DIAGNOSIS — G4733 Obstructive sleep apnea (adult) (pediatric): Secondary | ICD-10-CM | POA: Diagnosis not present

## 2022-03-12 ENCOUNTER — Other Ambulatory Visit: Payer: Self-pay | Admitting: Family Medicine

## 2022-03-12 DIAGNOSIS — M4727 Other spondylosis with radiculopathy, lumbosacral region: Secondary | ICD-10-CM

## 2022-03-12 DIAGNOSIS — M4307 Spondylolysis, lumbosacral region: Secondary | ICD-10-CM

## 2022-03-13 ENCOUNTER — Other Ambulatory Visit: Payer: Self-pay

## 2022-03-13 NOTE — Telephone Encounter (Signed)
Requested medication (s) are due for refill today - no  Requested medication (s) are on the active medication list -yes  Future visit scheduled -yes  Last refill: 02/14/22 #60  Notes to clinic: non delegated Rx  Requested Prescriptions  Pending Prescriptions Disp Refills   tiZANidine (ZANAFLEX) 4 MG tablet [Pharmacy Med Name: TIZANIDINE HCL 4 MG TABLET] 30 tablet 1    Sig: TAKE 1 TABLET BY MOUTH AT BEDTIME AS NEEDED FOR MUSCLE SPASMS.     Not Delegated - Cardiovascular:  Alpha-2 Agonists - tizanidine Failed - 03/12/2022 11:32 AM      Failed - This refill cannot be delegated      Passed - Valid encounter within last 6 months    Recent Outpatient Visits           3 months ago Spondylolysis, lumbosacral   [place] [facility], Ocie Bob, MD   5 months ago Spondylolysis, lumbosacral   [place] [facility], Ocie Bob, MD       Future Appointments             In 4 weeks Ashley Royalty, Ocie Bob, MD [facility] at [facility]               Requested Prescriptions  Pending Prescriptions Disp Refills   tiZANidine (ZANAFLEX) 4 MG tablet [Pharmacy Med Name: TIZANIDINE HCL 4 MG TABLET] 30 tablet 1    Sig: TAKE 1 TABLET BY MOUTH AT BEDTIME AS NEEDED FOR MUSCLE SPASMS.     Not Delegated - Cardiovascular:  Alpha-2 Agonists - tizanidine Failed - 03/12/2022 11:32 AM      Failed - This refill cannot be delegated      Passed - Valid encounter within last 6 months    Recent Outpatient Visits           3 months ago Spondylolysis, lumbosacral   [place] [facility], Ocie Bob, MD   5 months ago Spondylolysis, lumbosacral   [place] [facility] at [facility], Ocie Bob, MD       Future Appointments             In 4 weeks Ashley Royalty, Ocie Bob, MD [facility]  Primary Care and Sports Medicine at [facility]

## 2022-03-18 DIAGNOSIS — G4733 Obstructive sleep apnea (adult) (pediatric): Secondary | ICD-10-CM | POA: Diagnosis not present

## 2022-03-25 DIAGNOSIS — G4733 Obstructive sleep apnea (adult) (pediatric): Secondary | ICD-10-CM | POA: Diagnosis not present

## 2022-03-29 ENCOUNTER — Encounter: Payer: Self-pay | Admitting: Student in an Organized Health Care Education/Training Program

## 2022-03-29 ENCOUNTER — Ambulatory Visit
Admission: RE | Admit: 2022-03-29 | Discharge: 2022-03-29 | Disposition: A | Discharge status: Home / Self Care | Payer: BC Managed Care – PPO | Source: Ambulatory Visit | Attending: Student in an Organized Health Care Education/Training Program | Admitting: Student in an Organized Health Care Education/Training Program

## 2022-03-29 ENCOUNTER — Ambulatory Visit: Payer: BC Managed Care – PPO | Admitting: Student in an Organized Health Care Education/Training Program

## 2022-03-29 ENCOUNTER — Ambulatory Visit
Admission: RE | Admit: 2022-03-29 | Discharge: 2022-03-29 | Disposition: A | Discharge status: Home / Self Care | Payer: BC Managed Care – PPO | Attending: Nuclear Medicine | Admitting: Nuclear Medicine

## 2022-03-29 VITALS — BP 133/88 | HR 76 | Temp 97.3°F | Resp 16 | Ht 68.0 in | Wt 235.0 lb

## 2022-03-29 DIAGNOSIS — G894 Chronic pain syndrome: Secondary | ICD-10-CM

## 2022-03-29 DIAGNOSIS — M5136 Other intervertebral disc degeneration, lumbar region: Secondary | ICD-10-CM | POA: Insufficient documentation

## 2022-03-29 DIAGNOSIS — F17210 Nicotine dependence, cigarettes, uncomplicated: Secondary | ICD-10-CM | POA: Diagnosis not present

## 2022-03-29 DIAGNOSIS — Z8619 Personal history of other infectious and parasitic diseases: Secondary | ICD-10-CM | POA: Diagnosis not present

## 2022-03-29 DIAGNOSIS — Z8719 Personal history of other diseases of the digestive system: Secondary | ICD-10-CM | POA: Insufficient documentation

## 2022-03-29 DIAGNOSIS — M4306 Spondylolysis, lumbar region: Secondary | ICD-10-CM

## 2022-03-29 DIAGNOSIS — M533 Sacrococcygeal disorders, not elsewhere classified: Secondary | ICD-10-CM

## 2022-03-29 DIAGNOSIS — Z791 Long term (current) use of non-steroidal anti-inflammatories (NSAID): Secondary | ICD-10-CM | POA: Insufficient documentation

## 2022-03-29 DIAGNOSIS — G8929 Other chronic pain: Secondary | ICD-10-CM | POA: Insufficient documentation

## 2022-03-29 DIAGNOSIS — M791 Myalgia, unspecified site: Secondary | ICD-10-CM | POA: Diagnosis not present

## 2022-03-29 DIAGNOSIS — M5416 Radiculopathy, lumbar region: Secondary | ICD-10-CM | POA: Insufficient documentation

## 2022-03-29 DIAGNOSIS — M47816 Spondylosis without myelopathy or radiculopathy, lumbar region: Secondary | ICD-10-CM

## 2022-03-29 DIAGNOSIS — M4726 Other spondylosis with radiculopathy, lumbar region: Secondary | ICD-10-CM | POA: Insufficient documentation

## 2022-03-29 DIAGNOSIS — M5116 Intervertebral disc disorders with radiculopathy, lumbar region: Secondary | ICD-10-CM | POA: Diagnosis not present

## 2022-03-29 DIAGNOSIS — Z79899 Other long term (current) drug therapy: Secondary | ICD-10-CM | POA: Diagnosis not present

## 2022-03-29 NOTE — Progress Notes (Signed)
Patient: Gabriel Young  Service Category: E/M  Provider: Gillis Santa, MD  DOB: May 02, 1976  DOS: 03/29/2022  Referring Provider: Montel Culver, MD  MRN: [id number]  Setting: Ambulatory outpatient  PCP: Leonel Ramsay, MD  Type: New Patient  Specialty: Interventional Pain Management    Location: Office  Delivery: Face-to-face     Primary Reason(s) for Visit: Encounter for initial evaluation of one or more chronic problems (new to examiner) potentially causing chronic pain, and posing a threat to normal musculoskeletal function. (Level of risk: High) CC: Back Pain (lower)  HPI  [name] is a 46 y.o. year old, male patient, who comes for the first time to our practice referred by Montel Culver, MD for our initial evaluation of his chronic pain. He has EBV infection; Erectile dysfunction; Low back pain; Leg cramps; Smoker; Myalgia; GI bleed; Muscle spasm; Connective tissue disease (Schlusser); Sciatica; Pars defect of lumbar spine; Multilevel lumbosacral spondylosis with radiculopathy; Chronic radicular lumbar pain (left); Lumbar spondylosis; Lumbar degenerative disc disease; Sacroiliac joint pain; and Chronic pain syndrome on their problem list. Today he comes in for evaluation of his Back Pain (lower)  Pain Assessment: Location: Lower Back Radiating: cramping into torso, pain both groin and both legs to the toes Onset: More than a month ago Duration: Chronic pain Quality: Sharp, Cramping Severity: 4 /10 (subjective, self-reported pain score)  Effect on ADL: difficulty performing daily activities, frequent rest Timing: Constant Modifying factors: medications, lying without moving BP: 133/88  HR: 76  Onset and Duration: Present longer than 3 months Cause of pain: Unknown Severity: No change since onset, NAS-11 at its worse: 10/10, NAS-11 at its best: 4/10, NAS-11 now: 4/10, and NAS-11 on the average: 5/10 Timing: Not influenced by the time of the day, During activity or exercise, After  activity or exercise, and After a period of immobility Aggravating Factors: Bending, Climbing, Intercourse (sex), Kneeling, Lifiting, Motion, Prolonged sitting, Prolonged standing, Squatting, Twisting, and Working Alleviating Factors: Medications, Resting, Sleeping, and Warm showers or baths Associated Problems: Day-time cramps, Night-time cramps, Erectile dysfunction, and Pain that wakes patient up Quality of Pain: Cramping, Fearful, Feeling of constriction, Sharp, Shooting, and Tender Previous Examinations or Tests: MRI scan, X-rays, and Neurosurgical evaluation Previous Treatments: Facet blocks and Physical Therapy  Gabriel Young is a pleasant 46 year old 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  This has been going on for more than 3 months.  He is involved in opening sheet stores and states that he does a lot of bending lifting and twisting.  He is being referred by Dr. Zigmund Daniel.  He has seen [facility] in the past for this condition.  He has done 12 weeks of physical therapy, 2 times per week with [facility] within the last year.  He states that this did not help with his pain for his condition and in fact may have aggravated his low back pain.  He thinks he may have had a facet block in the past with [facility] but is unclear.  I informed him that we would obtain results from [facility].  He is complaining of left groin pain as well.  He utilizes a massage gun for his left groin.  He occasionally has spasms of his left calf as well.  He has tried various medications including NSAIDs, muscle relaxers, anticonvulsants.  He is currently on Zanaflex Lyrica 150 mg twice a day which she does find benefit from and alternates between meloxicam and Celebrex.  I advised him against taking both of these medications at the same  time.  Patient endorsed understanding.  He has had no inciting or traumatic event to explain his pain.  Meds   Current Outpatient Medications:    celecoxib (CELEBREX) 200 MG capsule, TAKE 1 CAP BY MOUTH 2 TIMES DAILY AS NEEDED. ONE TO 2 TABLETS BY MOUTH DAILY AS NEEDED FOR PAIN., Disp: 120 capsule, Rfl: 0   meloxicam (MOBIC) 7.5 MG tablet, Take 1 tablet by mouth daily., Disp: , Rfl:    pregabalin (LYRICA) 150 MG capsule, Take 150 mg by mouth 2 (two) times daily., Disp: , Rfl:    tiZANidine (ZANAFLEX) 4 MG tablet, TAKE 1 TABLET BY MOUTH AT BEDTIME AS NEEDED FOR MUSCLE SPASMS., Disp: 30 tablet, Rfl: 0   traZODone (DESYREL) 50 MG tablet, Take 50 mg by mouth at bedtime as needed., Disp: , Rfl:    Cholecalciferol (VITAMIN D3) 1.25 MG (50000 UT) CAPS, Take 1 capsule by mouth once a week., Disp: , Rfl:   Imaging Review   DG Thoracic Spine W/Swimmers  Narrative CLINICAL DATA:  Pain in lower thoracic spine and RIGHT lower back for 8 months, no known injury  EXAM: THORACIC SPINE - 3 VIEWS  COMPARISON:  None  FINDINGS: Twelve pairs of ribs.  Bones demineralized.  Minimal biconvex thoracic scoliosis.  Minor disc space narrowing and endplate spur formation at T9-T10.  Vertebral body heights maintained without fracture or subluxation.  Bones unremarkable.  IMPRESSION: Minimal biconvex thoracic scoliosis with degenerative disc disease changes at T9-T10.  No acute abnormalities.   Electronically Signed By: Lavonia Dana M.D. On: 05/20/2020 09:38   Narrative CLINICAL DATA:  Spondylolysis chronic lumbar pain with spondylolysis and left radiculopathy  EXAM: MRI LUMBAR SPINE WITHOUT CONTRAST  TECHNIQUE: Multiplanar, multisequence MR imaging of the lumbar spine was performed. No intravenous contrast was administered.  COMPARISON:  Outside MRI without report from August 31, 2020.  FINDINGS: Segmentation: Standard segmentation is assumed. The inferior-most fully formed  intervertebral disc labeled L5-S1.  Alignment:  Grade 1 (nearly grade 2) anterolisthesis of L5 on S1.  Vertebrae: Bilateral L5 pars defects. No marrow edema chest acute fracture or discitis/osteomyelitis.  Conus medullaris and cauda equina: Conus extends to the T12-L1 level. Conus appears normal.  Paraspinal and other soft tissues: Small left renal cyst.  Disc levels:  T12-L1: No significant disc protrusion, foraminal stenosis, or canal stenosis.  L1-L2: Mild disc bulging. Mild bilateral foraminal stenosis. No significant canal stenosis.  L2-L3: Mild left eccentric disc bulging. Mild left foraminal stenosis. Mild left subarticular recess stenosis without significant canal or right foraminal stenosis.  L3-L4: Mild disc bulging and bilateral facet arthropathy without significant canal or foraminal stenosis.  L4-L5: Bilateral facet arthropathy. Mild endplate spurring. Mild bilateral foraminal stenosis. Patent canal.  L5-S1: Bilateral L5 pars defects. Grade 1 (nearly grade 2) anterolisthesis of L5 on S1. Associated uncovering the disc with bilateral facet arthropathy. Moderate right and mild left foraminal stenosis. Patent canal.  IMPRESSION: 1. Bilateral L5 pars defects with grade 1 (nearly grade 2) anterolisthesis of L5 on S1. Moderate right and mild left foraminal stenosis at this level. 2. Mild foraminal stenosis on the left at L2-L3 and bilaterally at L4-L5.   Electronically Signed By: Margaretha Sheffield M.D. On: 10/25/2021 09:19  Narrative CLINICAL DATA:  Pain in lower thoracic spine and RIGHT  lower back for 8 months, no known injury  EXAM: LUMBAR SPINE - 2-3 VIEW  COMPARISON:  None  FINDINGS: 5 non-rib-bearing lumbar vertebra.  Bones demineralized.  Biconvex thoracolumbar scoliosis.  Multilevel disc space narrowing.  Grade 1 anterolisthesis L5-S1, suspect due to facet degenerative changes; no definite spondylolysis identified.  Minimal  retrolisthesis at L4-L5.  Disc space narrowing and endplate spur formation L1-L2.  No fracture, additional subluxation, or bone destruction.  SI joints preserved.  IMPRESSION: Degenerative changes of lumbar spine as above with mild biconvex thoracolumbar scoliosis.   Electronically Signed By: Lavonia Dana M.D. On: 05/20/2020 09:41   Narrative CLINICAL DATA:  Back pain with radiation down right leg.  EXAM: LUMBAR SPINE - COMPLETE 4+ VIEW  COMPARISON:  X-ray 05/20/2020; MRI images 08/31/2020.  FINDINGS: Lumbar spine numbered with the lowest segmented appearing lumbar shaped vertebra on lateral view as L5. Mild scoliosis concave left again noted. Diffuse multilevel degenerative change again noted. Disc degeneration and endplate osteophyte formation most prominent at T12-L1 and L1-L2. Similar findings noted on prior exam. Stable mild L5-S1 anterolisthesis and L4-L5 retrolisthesis. No acute bony abnormality. Aortic atherosclerotic vascular calcification.  IMPRESSION: 1. Mild scoliosis concave left again noted. Diffuse multilevel degenerative change again noted. Disc degeneration and endplate osteophyte formation most prominent T12-L1 and L1-L2. Similar findings noted on prior exam.  2. Stable mild L5-S1 anterolisthesis and L4-L5 retrolisthesis. No acute bony abnormality.  3.  Aortic atherosclerotic vascular disease.   Electronically Signed By: Marcello Moores  Register M.D. On: 10/09/2021 13:06  Narrative CLINICAL DATA:  Pain in lower thoracic spine and RIGHT lower back for 8 months, no known injury  EXAM: LUMBAR SPINE FLEX AND EXTEND ONLY - 2-3 VIEW  COMPARISON:  Lumbar spine radiographs 05/20/2020  FINDINGS: 5 lumbar vertebra.  Disc space narrowing and endplate spur formation at L1-L2.  Grade 1 anterolisthesis L5-S1 with minimal retrolisthesis at L4-L5  Little flexion demonstrated.  Better extension demonstrated.  No change in alignment with flexion or  extension.  IMPRESSION: Minimal retrolisthesis at L4-L5 and grade 1 anterolisthesis at L5-S1.  No change with flexion or extension.  Degenerative disc disease changes L1-L2.   Electronically Signed By: Lavonia Dana M.D. On: 05/20/2020 09:40   Complexity Note: Imaging results reviewed.                         ROS  Cardiovascular: No reported cardiovascular signs or symptoms such as High blood pressure, coronary artery disease, abnormal heart rate or rhythm, heart attack, blood thinner therapy or heart weakness and/or failure Pulmonary or Respiratory: Smoking, Snoring , and Temporary stoppage of breathing during sleep Neurological: Curved spine Psychological-Psychiatric: No reported psychological or psychiatric signs or symptoms such as difficulty sleeping, anxiety, depression, delusions or hallucinations (schizophrenial), mood swings (bipolar disorders) or suicidal ideations or attempts Gastrointestinal: Reflux or heatburn Genitourinary: No reported renal or genitourinary signs or symptoms such as difficulty voiding or producing urine, peeing blood, non-functioning kidney, kidney stones, difficulty emptying the bladder, difficulty controlling the flow of urine, or chronic kidney disease Hematological: Weakness due to low blood hemoglobin or red blood cell count (Anemia) and Low platelet levels (Thrombocytopenia) Endocrine: No reported endocrine signs or symptoms such as high or low blood sugar, rapid heart rate due to high thyroid levels, obesity or weight gain due to slow thyroid or thyroid disease Rheumatologic: No reported rheumatological signs and symptoms such as fatigue, joint pain, tenderness, swelling, redness, heat, stiffness, decreased range of motion, with or  without associated rash Musculoskeletal: Negative for myasthenia gravis, muscular dystrophy, multiple sclerosis or malignant hyperthermia Work History: Working full time  Allergies  [name] is allergic to  penicillins.  Laboratory Chemistry Profile   Renal No results found for: "BUN", "CREATININE", "LABCREA", "BCR", "GFR", "GFRAA", "GFRNONAA", "SPECGRAV", "PHUR", "PROTEINUR"   Electrolytes No results found for: "NA", "K", "CL", "CALCIUM", "MG", "PHOS"   Hepatic No results found for: "AST", "ALT", "ALBUMIN", "ALKPHOS", "AMYLASE", "LIPASE", "AMMONIA"   ID Lab Results  Component Value Date   HIV NON-REACTIVE 10/21/2020     Bone Lab Results  Component Value Date   TESTOSTERONE 433 06/17/2020     Endocrine Lab Results  Component Value Date   TSH 1.09 10/21/2020   TESTOSTERONE 433 06/17/2020     Neuropathy Lab Results  Component Value Date   HIV NON-REACTIVE 10/21/2020     CNS No results found for: "COLORCSF", "APPEARCSF", "RBCCOUNTCSF", "WBCCSF", "POLYSCSF", "LYMPHSCSF", "EOSCSF", "PROTEINCSF", "GLUCCSF", "JCVIRUS", "CSFOLI", "IGGCSF", "LABACHR", "ACETBL"   Inflammation (CRP: Acute  ESR: Chronic) Lab Results  Component Value Date   CRP 2.0 10/21/2020   ESRSEDRATE 11 10/21/2020     Rheumatology Lab Results  Component Value Date   RF <14 10/21/2020   ANA POSITIVE (A) 10/21/2020     Coagulation No results found for: "INR", "LABPROT", "APTT", "PLT", "DDIMER", "LABHEMA", "VITAMINK1", "AT3"   Cardiovascular Lab Results  Component Value Date   CKTOTAL 132 10/21/2020     Screening Lab Results  Component Value Date   HIV NON-REACTIVE 10/21/2020     Cancer No results found for: "CEA", "CA125", "LABCA2"   Allergens No results found for: "ALMOND", "APPLE", "ASPARAGUS", "AVOCADO", "BANANA", "BARLEY", "BASIL", "BAYLEAF", "GREENBEAN", "LIMABEAN", "WHITEBEAN", "BEEFIGE", "REDBEET", "BLUEBERRY", "BROCCOLI", "CABBAGE", "MELON", "CARROT", "CASEIN", "CASHEWNUT", "CAULIFLOWER", "CELERY"     Note: Lab results reviewed.  PFSH  Drug: [name]  reports that he does not currently use drugs. Alcohol:  reports current alcohol use. Tobacco:  reports that he has been smoking  cigarettes. He has never used smokeless tobacco. Medical:  has a past medical history of EBV infection (10/21/2020), Erectile dysfunction (10/21/2020), GI bleed (10/21/2020), Leg cramps (10/21/2020), Low back pain (10/21/2020), Muscle spasm (10/21/2020), Myalgia (10/21/2020), and Smoker (10/21/2020). Family: family history is not on file.  No past surgical history on file. Active Ambulatory Problems    Diagnosis Date Noted   EBV infection 10/21/2020   Erectile dysfunction 10/21/2020   Low back pain 10/21/2020   Leg cramps 10/21/2020   Smoker 10/21/2020   Myalgia 10/21/2020   GI bleed 10/21/2020   Muscle spasm 10/21/2020   Connective tissue disease (HCC) 08/04/2021   Sciatica 08/04/2021   Pars defect of lumbar spine 10/06/2021   Multilevel lumbosacral spondylosis with radiculopathy 10/06/2021   Chronic radicular lumbar pain (left) 03/29/2022   Lumbar spondylosis 03/29/2022   Lumbar degenerative disc disease 03/29/2022   Sacroiliac joint pain 03/29/2022   Chronic pain syndrome 03/29/2022   Resolved Ambulatory Problems    Diagnosis Date Noted   No Resolved Ambulatory Problems   No Additional Past Medical History   Constitutional Exam  General appearance: Well nourished, well developed, and well hydrated. In no apparent acute distress Vitals:   03/29/22 0908  BP: 133/88  Pulse: 76  Resp: 16  Temp: (!) 97.3 F (36.3 C)  TempSrc: Temporal  SpO2: 99%  Weight: 235 lb (106.6 kg)  Height: 5\' 8"  (1.727 m)   BMI Assessment: Estimated body mass index is 35.73 kg/m as calculated from the following:  Height as of this encounter: _0  (1.727 m).   Weight as of this encounter: 235 lb (106.6 kg).  BMI interpretation table: BMI level Category Range association with higher incidence of chronic pain  <18 kg/m2 Underweight   18.5-24.9 kg/m2 Ideal body weight   25-29.9 kg/m2 Overweight Increased incidence by 20%  30-34.9 kg/m2 Obese (Class I) Increased incidence by 68%  35-39.9 kg/m2 Severe  obesity (Class II) Increased incidence by 136%  >40 kg/m2 Extreme obesity (Class III) Increased incidence by 254%   Patient's current BMI Ideal Body weight  Body mass index is 35.73 kg/m. Ideal body weight: 68.4 kg (150 lb 12.7 oz) Adjusted ideal body weight: 83.7 kg (184 lb 7.6 oz)   BMI Readings from Last 4 Encounters:  03/29/22 35.73 kg/m  12/08/21 35.88 kg/m  10/06/21 33.75 kg/m  10/21/20 35.12 kg/m   Wt Readings from Last 4 Encounters:  03/29/22 235 lb (106.6 kg)  12/08/21 236 lb (107 kg)  10/06/21 222 lb (100.7 kg)  10/21/20 231 lb (104.8 kg)    Psych/Mental status: Alert, oriented x 3 (person, place, & time)       Eyes: PERLA Respiratory: No evidence of acute respiratory distress  Thoracic Spine Area Exam  Skin & Axial Inspection: No masses, redness, or swelling Alignment: Symmetrical Functional ROM: Unrestricted ROM Stability: No instability detected Muscle Tone/Strength: Functionally intact. No obvious neuro-muscular anomalies detected. Sensory (Neurological): Unimpaired Muscle strength & Tone: No palpable anomalies Lumbar Spine Area Exam  Skin & Axial Inspection: No masses, redness, or swelling Alignment: Symmetrical Functional ROM: Pain restricted ROM       Stability: No instability detected Muscle Tone/Strength: Functionally intact. No obvious neuro-muscular anomalies detected. Sensory (Neurological): Dermatomal pain pattern and facet mediated Palpation: No palpable anomalies       Provocative Tests: Hyperextension/rotation test: (+) bilaterally for facet joint pain. Lumbar quadrant test (Kemp's test): (+) bilaterally for facet joint pain. Lateral bending test: (+) ipsilateral radicular pain, bilaterally. Positive for bilateral foraminal stenosis. Patrick's Maneuver: (+) for bilateral S-I arthralgia             FABER* test: (+) for bilateral S-I arthralgia             S-I anterior distraction/compression test: (+) for bilateral S-I arthralgia              S-I lateral compression test: (+) for bilateral S-I arthralgia             S-I Thigh-thrust test: (+) for bilateral S-I arthralgia             S-I Gaenslen's test: (+) for bilateral S-I arthralgia             *(Flexion, ABduction and External Rotation) Gait & Posture Assessment  Ambulation: Unassisted Gait: Relatively normal for age and body habitus Posture: WNL  Lower Extremity Exam    Side: Right lower extremity  Side: Left lower extremity  Stability: No instability observed          Stability: No instability observed          Skin & Extremity Inspection: Skin color, temperature, and hair growth are WNL. No peripheral edema or cyanosis. No masses, redness, swelling, asymmetry, or associated skin lesions. No contractures.  Skin & Extremity Inspection: Skin color, temperature, and hair growth are WNL. No peripheral edema or cyanosis. No masses, redness, swelling, asymmetry, or associated skin lesions. No contractures.  Functional ROM: Unrestricted ROM  Functional ROM: Unrestricted ROM                  Muscle Tone/Strength: Functionally intact. No obvious neuro-muscular anomalies detected.  Muscle Tone/Strength: Functionally intact. No obvious neuro-muscular anomalies detected.  Sensory (Neurological): Neurogenic pain pattern        Sensory (Neurological): Neurogenic pain pattern        DTR: Patellar: deferred today Achilles: deferred today Plantar: deferred today  DTR: Patellar: deferred today Achilles: deferred today Plantar: deferred today  Palpation: No palpable anomalies  Palpation: No palpable anomalies   5 out of 5 strength bilateral lower extremity: Plantar flexion, dorsiflexion, knee flexion, knee extension.   Assessment  Primary Diagnosis & Pertinent Problem List: The primary encounter diagnosis was Pars defect of lumbar spine (L5). Diagnoses of Chronic radicular lumbar pain (left), Lumbar facet arthropathy, Lumbar spondylosis, Lumbar degenerative disc  disease, Sacroiliac joint pain, and Chronic pain syndrome were also pertinent to this visit.  Visit Diagnosis (New problems to examiner): 1. Pars defect of lumbar spine (L5)   2. Chronic radicular lumbar pain (left)   3. Lumbar facet arthropathy   4. Lumbar spondylosis   5. Lumbar degenerative disc disease   6. Sacroiliac joint pain   7. Chronic pain syndrome    Plan of Care (Initial workup plan)   I discussed initial work-up plan with Gabriel Young.  I informed him that we need to get records from [facility] regarding his interventional procedures that were performed.  Patient's pain is likely multifactorial form lumbar facet arthropathy, L5 pars defect and associated lumbar neuroforaminal stenosis.  He could also have SI joint and piriformis involvement contributing to his pain.  For the time being, I would like to start with an SI joint x-ray as he does have exam findings suggestive of SI joint arthropathy and dysfunction.  Interventional options may include left L2-L3 ESI, left L5-S1 ESI, lumbar facet medial branch nerve blocks (RFA) and possibly SI joint and/or piriformis injection.  I will discuss treatment plan with patient once I have his records from [facility] and he has completed his SI joint x-rays.  Patient endorsed understanding and was in agreement with treatment plan.  1. Pars defect of lumbar spine (L5) -Bilateral L5 pars defects with grade 1 (nearly grade 2) anterolisthesis of L5 on S1  2. Chronic radicular lumbar pain (left)    -Consider LEFT L2/3 or L5/S1 ESI based on clinical symptoms and MRI finding    -Continue Lyrica 150 mg twice a day  3. Lumbar facet arthropathy   -Discussed diagnostic lumbar facet medial branch nerve blocks   -We will obtain records from [facility] to review procedural history  4. Lumbar spondylosis   -Discussed diagnostic lumbar facet medial branch nerve blocks   -We will obtain records from [facility] to review procedural history  5. Lumbar  degenerative disc disease -Continue NSAIDS PRN  6. Sacroiliac joint pain - DG Si Joints; Future  7. Chronic pain syndrome - DG Si Joints; Future   Provider-requested follow-up: Return for will call pt to discuss treatment plan.  I spent a total of 60 minutes reviewing chart data, face-to-face evaluation with the patient, counseling and coordination of care as detailed above.   Future Appointments  Date Time Provider [place]  04/11/2022  1:40 PM Montel Culver, MD [facility]    Note by: Gillis Santa, MD Date: 03/29/2022; Time: 10:10 AM

## 2022-03-29 NOTE — Patient Instructions (Signed)
1.  We will obtain records from [facility] regarding your previous procedure. 2.  X-ray today of your sacroiliac joint as this could be a source of your groin pain. 3.  Consideration of lumbar epidural steroid injection versus diagnostic lumbar facet medial branch nerve blocks based upon SI joint x-ray, records from [facility].

## 2022-03-29 NOTE — Progress Notes (Signed)
Safety precautions to be maintained throughout the outpatient stay will include: orient to surroundings, keep bed in low position, maintain call bell within reach at all times, provide assistance with transfer out of bed and ambulation.  

## 2022-04-07 ENCOUNTER — Other Ambulatory Visit: Payer: Self-pay | Admitting: Family Medicine

## 2022-04-07 DIAGNOSIS — M4727 Other spondylosis with radiculopathy, lumbosacral region: Secondary | ICD-10-CM

## 2022-04-07 DIAGNOSIS — M4307 Spondylolysis, lumbosacral region: Secondary | ICD-10-CM

## 2022-04-09 NOTE — Telephone Encounter (Signed)
Requested medication (s) are due for refill today: Yes  Requested medication (s) are on the active medication list: Yes  Last refill:  03/13/22  Future visit scheduled: Yes  Notes to clinic:  See request.    Requested Prescriptions  Pending Prescriptions Disp Refills   tiZANidine (ZANAFLEX) 4 MG tablet [Pharmacy Med Name: TIZANIDINE HCL 4 MG TABLET] 30 tablet 0    Sig: TAKE 1 TABLET BY MOUTH AT BEDTIME AS NEEDED FOR MUSCLE SPASMS.     Not Delegated - Cardiovascular:  Alpha-2 Agonists - tizanidine Failed - 04/07/2022  8:31 AM      Failed - This refill cannot be delegated      Passed - Valid encounter within last 6 months    Recent Outpatient Visits           4 months ago Spondylolysis, lumbosacral   [facility], Earley Abide, MD   6 months ago Spondylolysis, lumbosacral   [facility] at [facility], Earley Abide, MD       Future Appointments             In 2 days Zigmund Daniel, Earley Abide, MD [facility] at [facility], [facility]

## 2022-04-10 ENCOUNTER — Ambulatory Visit: Payer: BC Managed Care – PPO | Admitting: Family Medicine

## 2022-04-11 ENCOUNTER — Encounter: Payer: Self-pay | Admitting: Family Medicine

## 2022-04-11 ENCOUNTER — Ambulatory Visit: Payer: BC Managed Care – PPO | Admitting: Family Medicine

## 2022-04-11 VITALS — BP 128/76 | HR 74 | Ht 69.0 in | Wt 240.0 lb

## 2022-04-11 DIAGNOSIS — Z23 Encounter for immunization: Secondary | ICD-10-CM

## 2022-04-11 DIAGNOSIS — Z0000 Encounter for general adult medical examination without abnormal findings: Secondary | ICD-10-CM

## 2022-04-11 DIAGNOSIS — N529 Male erectile dysfunction, unspecified: Secondary | ICD-10-CM

## 2022-04-11 DIAGNOSIS — Z13220 Encounter for screening for lipoid disorders: Secondary | ICD-10-CM

## 2022-04-11 DIAGNOSIS — M4727 Other spondylosis with radiculopathy, lumbosacral region: Secondary | ICD-10-CM

## 2022-04-11 DIAGNOSIS — Z1159 Encounter for screening for other viral diseases: Secondary | ICD-10-CM | POA: Diagnosis not present

## 2022-04-11 DIAGNOSIS — R7989 Other specified abnormal findings of blood chemistry: Secondary | ICD-10-CM

## 2022-04-11 HISTORY — DX: Encounter for general adult medical examination without abnormal findings: Z00.00

## 2022-04-11 MED ORDER — MELOXICAM 7.5 MG PO TABS: 7.5000 mg | ORAL_TABLET | Freq: Every day | ORAL | 0 refills | Status: DC

## 2022-04-11 MED ORDER — SILDENAFIL CITRATE 20 MG PO TABS: 20.0000 mg | ORAL_TABLET | ORAL | 1 refills | Status: AC | PRN

## 2022-04-11 MED ORDER — PREGABALIN 150 MG PO CAPS: 150.0000 mg | ORAL_CAPSULE | Freq: Two times a day (BID) | ORAL | 0 refills | Status: AC

## 2022-04-11 NOTE — Patient Instructions (Signed)
-   Obtain fasting labs with orders provided (can have water or black coffee but otherwise no food or drink x 8 hours before labs) - Review information provided - Attend eye doctor annually, dentist every 6 months, work towards or maintain 30 minutes of moderate intensity physical activity at least 5 days per week, and consume a balanced diet - Return in 3 months - Contact us for any questions between now and then 

## 2022-04-11 NOTE — Progress Notes (Signed)
Annual Physical Exam Visit  Patient Information:  Patient ID: Gabriel Young, male DOB: 1976/06/04 Age: 46 y.o. MRN: [id number]   Subjective:   CC: Annual Physical Exam  HPI:  Gabriel Young is here for their annual physical.  I reviewed the past medical history, family history, social history, surgical history, and allergies today and changes were made as necessary.  Please see the problem list section below for additional details.  Past Medical History: Past Medical History:  Diagnosis Date   EBV infection 10/21/2020   Erectile dysfunction 10/21/2020   GERD (gastroesophageal reflux disease) 2020   GI bleed 2020   Leg cramps 10/21/2020   Low back pain 10/21/2020   Muscle spasm 10/21/2020   Myalgia 10/21/2020   Sleep apnea 2023   Smoker 10/21/2020   Past Surgical History: Past Surgical History:  Procedure Laterality Date   WISDOM TOOTH EXTRACTION  2000   Family History: Family History  Problem Relation Age of Onset   Asthma Mother        passed at age 23   Cancer Father        leukemia (dx 70)   Allergies: Allergies  Allergen Reactions   Penicillins Other (See Comments) and Rash   Health Maintenance: Health Maintenance  Topic Date Due   Hepatitis C Screening  Never done   INFLUENZA VACCINE  10/14/2022 (Originally 02/13/2022)   COLONOSCOPY (Pts 45-1yrs Insurance coverage will need to be confirmed)  04/12/2023 (Originally 11/28/2021)   COVID-19 Vaccine (1) 04/27/2026 (Originally 05/31/1976)   TETANUS/TDAP  04/11/2032   HIV Screening  Completed   HPV VACCINES  Aged Out    HM Colonoscopy          Postponed - COLONOSCOPY (Pts 45-42yrs Insurance coverage will need to be confirmed) (Every 10 Years) Postponed until 04/12/2023    No completion history exists for this topic.           Medications: Current Outpatient Medications on File Prior to Visit  Medication Sig Dispense Refill   celecoxib (CELEBREX) 200 MG capsule TAKE 1 CAP BY MOUTH 2 TIMES DAILY AS  NEEDED. ONE TO 2 TABLETS BY MOUTH DAILY AS NEEDED FOR PAIN. 120 capsule 0   Cholecalciferol (VITAMIN D3) 1.25 MG (50000 UT) CAPS Take 1 capsule by mouth once a week.     tiZANidine (ZANAFLEX) 4 MG tablet TAKE 1 TABLET BY MOUTH AT BEDTIME AS NEEDED FOR MUSCLE SPASMS. 30 tablet 0   traZODone (DESYREL) 50 MG tablet Take 50 mg by mouth at bedtime as needed.     No current facility-administered medications on file prior to visit.    Review of Systems: No headache, visual changes, nausea, vomiting, diarrhea, constipation, dizziness, abdominal pain, skin rash, fevers, chills, night sweats, swollen lymph nodes, weight loss, chest pain, body aches, joint swelling, muscle aches, shortness of breath, mood changes, visual or auditory hallucinations reported.  Objective:   Vitals:   04/11/22 1335  BP: 128/76  Pulse: 74  SpO2: 98%   Vitals:   04/11/22 1335  Weight: 240 lb (108.9 kg)  Height: 5\' 9"  (1.753 m)   Body mass index is 35.44 kg/m.  General: Well Developed, well nourished, and in no acute distress.  Neuro: Alert and oriented x3, extra-ocular muscles intact, sensation grossly intact. Cranial nerves II through XII are grossly intact, motor, sensory, and coordinative functions are intact. HEENT: Normocephalic, atraumatic, pupils equal round reactive to light, neck supple, no masses, no lymphadenopathy, thyroid nonpalpable. Oropharynx, nasopharynx, external ear canals  are unremarkable. Skin: Warm and dry, no rashes noted. Right forefoot with plantar callus, benign Cardiac: Regular rate and rhythm, no murmurs rubs or gallops. No peripheral edema. Pulses symmetric. Respiratory: Clear to auscultation bilaterally. Not using accessory muscles, speaking in full sentences.  Abdominal: Soft, nontender, nondistended, positive bowel sounds, no masses, no organomegaly. Musculoskeletal: Shoulder, elbow, wrist, hip, knee, ankle stable, and with full range of motion.   Impression and Recommendations:    The patient was counselled, risk factors were discussed, and anticipatory guidance given.  Problem List Items Addressed This Visit       Nervous and Auditory   Multilevel lumbosacral spondylosis with radiculopathy    Has established with pain and spine physician Dr. Cherylann Ratel, is awaiting additional records but interventional spine procedures have been discussed.  We will continue to follow peripherally on this issue.      Relevant Medications   meloxicam (MOBIC) 7.5 MG tablet   pregabalin (LYRICA) 150 MG capsule     Other   Erectile dysfunction    Chronic issue, has trialed Cialis in the past without response, primary concern is over obtaining and maintaining erection, will trial alternate sildenafil, varying doses discussed.  Additionally, a.m. testosterone levels ordered.      Relevant Medications   sildenafil (REVATIO) 20 MG tablet   Other Relevant Orders   Testosterone,Free and Total   Annual physical exam - Primary    Annual examination completed, risk stratification labs ordered, anticipatory guidance provided.  We will follow labs once resulted.      Relevant Orders   CBC   Comprehensive metabolic panel   Hepatitis C antibody   Lipid panel   TSH   VITAMIN D 25 Hydroxy (Vit-D Deficiency, Fractures)   Tdap vaccine greater than or equal to 7yo IM (Completed)   Testosterone,Free and Total   Need for diphtheria-tetanus-pertussis (Tdap) vaccine    Tdap administered today.      Relevant Orders   Tdap vaccine greater than or equal to 7yo IM (Completed)   Other Visit Diagnoses     Need for hepatitis C screening test       Relevant Orders   Hepatitis C antibody   Screening for lipoid disorders       Relevant Orders   Comprehensive metabolic panel   Lipid panel   Low serum vitamin D       Relevant Orders   VITAMIN D 25 Hydroxy (Vit-D Deficiency, Fractures)        Orders & Medications Medications:  Meds ordered this encounter  Medications   sildenafil  (REVATIO) 20 MG tablet    Sig: Take 1-5 tablets (20-100 mg total) by mouth as needed.    Dispense:  50 tablet    Refill:  1   meloxicam (MOBIC) 7.5 MG tablet    Sig: Take 1 tablet (7.5 mg total) by mouth daily.    Dispense:  90 tablet    Refill:  0   pregabalin (LYRICA) 150 MG capsule    Sig: Take 1 capsule (150 mg total) by mouth 2 (two) times daily.    Dispense:  180 capsule    Refill:  0   Orders Placed This Encounter  Procedures   Tdap vaccine greater than or equal to 7yo IM   CBC   Comprehensive metabolic panel   Hepatitis C antibody   Lipid panel   TSH   VITAMIN D 25 Hydroxy (Vit-D Deficiency, Fractures)   Testosterone,Free and Total     Return in  about 3 months (around 07/11/2022).    Montel Culver, MD   [facility]

## 2022-04-11 NOTE — Assessment & Plan Note (Signed)
Has established with pain and spine physician Dr. Holley Raring, is awaiting additional records but interventional spine procedures have been discussed.  We will continue to follow peripherally on this issue.

## 2022-04-11 NOTE — Assessment & Plan Note (Signed)
Tdap administered today. 

## 2022-04-11 NOTE — Assessment & Plan Note (Signed)
Annual examination completed, risk stratification labs ordered, anticipatory guidance provided.  We will follow labs once resulted. 

## 2022-04-11 NOTE — Assessment & Plan Note (Signed)
Chronic issue, has trialed Cialis in the past without response, primary concern is over obtaining and maintaining erection, will trial alternate sildenafil, varying doses discussed.  Additionally, a.m. testosterone levels ordered.

## 2022-04-12 ENCOUNTER — Telehealth: Payer: Self-pay

## 2022-04-12 NOTE — Telephone Encounter (Signed)
PA completed waiting on insurance approval.  Key: [id number]  KP

## 2022-04-13 ENCOUNTER — Ambulatory Visit: Payer: BC Managed Care – PPO | Admitting: Family Medicine

## 2022-04-17 ENCOUNTER — Other Ambulatory Visit: Payer: Self-pay | Admitting: Family Medicine

## 2022-04-17 DIAGNOSIS — M4727 Other spondylosis with radiculopathy, lumbosacral region: Secondary | ICD-10-CM

## 2022-04-17 DIAGNOSIS — G4733 Obstructive sleep apnea (adult) (pediatric): Secondary | ICD-10-CM | POA: Diagnosis not present

## 2022-04-17 DIAGNOSIS — M4307 Spondylolysis, lumbosacral region: Secondary | ICD-10-CM

## 2022-04-17 NOTE — Telephone Encounter (Signed)
Requested medications are due for refill today.  yes  Requested medications are on the active medications list.  yes  Last refill. 02/14/2022 #120 0 rf  Future visit scheduled.   yes  Notes to clinic.  Labs are expired.    Requested Prescriptions  Pending Prescriptions Disp Refills   celecoxib (CELEBREX) 200 MG capsule [Pharmacy Med Name: CELECOXIB 200 MG CAPSULE] 60 capsule 1    Sig: TAKE 1 CAP BY MOUTH 2 TIMES DAILY AS NEEDED. ONE TO 2 TABLETS BY MOUTH DAILY AS NEEDED FOR PAIN.     Analgesics:  COX2 Inhibitors Failed - 04/17/2022  2:30 AM      Failed - Manual Review: Labs are only required if the patient has taken medication for more than 8 weeks.      Failed - HGB in normal range and within 360 days    No results found for: "HGB", "HGBKUC", "HGBPOCKUC", "HGBOTHER", "TOTHGB", "HGBPLASMA"       Failed - Cr in normal range and within 360 days    No results found for: "CREATININE", "LABCREAU", "LABCREA", "POCCRE"       Failed - HCT in normal range and within 360 days    No results found for: "HCT", "HCTKUC", "SRHCT"       Failed - AST in normal range and within 360 days    No results found for: "POCAST", "AST"       Failed - ALT in normal range and within 360 days    No results found for: "ALT", "LABALT", "POCALT"       Failed - eGFR is 30 or above and within 360 days    No results found for: "GFRAA", "GFRNONAA", "GFR", "EGFR"       Passed - Patient is not pregnant      Passed - Valid encounter within last 12 months    Recent Outpatient Visits           6 days ago Annual physical exam   [place] [facility], Earley Abide, MD   4 months ago Spondylolysis, lumbosacral   [place] [facility] at [place], Earley Abide, MD   6 months ago Spondylolysis, lumbosacral   [place] [facility] at [facility], Earley Abide, MD       Future Appointments              In 2 months Zigmund Daniel, Earley Abide, MD [facility] at [facility], [facility]

## 2022-04-19 NOTE — Telephone Encounter (Signed)
I have left 2 VM for pt to return call to find out which medication he is on.

## 2022-04-24 ENCOUNTER — Telehealth: Payer: Self-pay

## 2022-04-24 NOTE — Telephone Encounter (Deleted)
Approved 04/24/23-10/22/2022.  Called pt left VM to call back.  PEC may give results if patient returns call - CRM created.  KP

## 2022-04-25 NOTE — Telephone Encounter (Signed)
Error.     KP 

## 2022-04-27 NOTE — Telephone Encounter (Signed)
Declined  KP

## 2022-05-04 DIAGNOSIS — R7989 Other specified abnormal findings of blood chemistry: Secondary | ICD-10-CM | POA: Diagnosis not present

## 2022-05-04 DIAGNOSIS — Z0000 Encounter for general adult medical examination without abnormal findings: Secondary | ICD-10-CM | POA: Diagnosis not present

## 2022-05-04 DIAGNOSIS — N529 Male erectile dysfunction, unspecified: Secondary | ICD-10-CM | POA: Diagnosis not present

## 2022-05-04 DIAGNOSIS — Z13220 Encounter for screening for lipoid disorders: Secondary | ICD-10-CM | POA: Diagnosis not present

## 2022-05-04 DIAGNOSIS — Z1159 Encounter for screening for other viral diseases: Secondary | ICD-10-CM | POA: Diagnosis not present

## 2022-05-10 ENCOUNTER — Other Ambulatory Visit: Payer: Self-pay | Admitting: Family Medicine

## 2022-05-10 DIAGNOSIS — E785 Hyperlipidemia, unspecified: Secondary | ICD-10-CM

## 2022-05-10 DIAGNOSIS — R7989 Other specified abnormal findings of blood chemistry: Secondary | ICD-10-CM

## 2022-05-10 LAB — COMPREHENSIVE METABOLIC PANEL
ALT: 24 IU/L (ref 0–44)
AST: 19 IU/L (ref 0–40)
Albumin/Globulin Ratio: 2.1 (ref 1.2–2.2)
Albumin: 4.4 g/dL (ref 4.1–5.1)
Alkaline Phosphatase: 113 IU/L (ref 44–121)
BUN/Creatinine Ratio: 13 (ref 9–20)
BUN: 11 mg/dL (ref 6–24)
Bilirubin Total: 0.2 mg/dL (ref 0.0–1.2)
CO2: 21 mmol/L (ref 20–29)
Calcium: 9.4 mg/dL (ref 8.7–10.2)
Chloride: 102 mmol/L (ref 96–106)
Creatinine, Ser: 0.87 mg/dL (ref 0.76–1.27)
Globulin, Total: 2.1 g/dL (ref 1.5–4.5)
Glucose: 110 mg/dL — ABNORMAL HIGH (ref 70–99)
Potassium: 4.2 mmol/L (ref 3.5–5.2)
Sodium: 137 mmol/L (ref 134–144)
Total Protein: 6.5 g/dL (ref 6.0–8.5)
eGFR: 108 mL/min/{1.73_m2} (ref >59)

## 2022-05-10 LAB — LIPID PANEL
Chol/HDL Ratio: 6.6 ratio — ABNORMAL HIGH (ref 0.0–5.0)
Cholesterol, Total: 191 mg/dL (ref 100–199)
HDL: 29 mg/dL — ABNORMAL LOW (ref >39)
LDL Chol Calc (NIH): 150 mg/dL — ABNORMAL HIGH (ref 0–99)
Triglycerides: 67 mg/dL (ref 0–149)
VLDL Cholesterol Cal: 12 mg/dL (ref 5–40)

## 2022-05-10 LAB — CBC
Hematocrit: 39.1 % (ref 37.5–51.0)
Hemoglobin: 13.8 g/dL (ref 13.0–17.7)
MCH: 32 pg (ref 26.6–33.0)
MCHC: 35.3 g/dL (ref 31.5–35.7)
MCV: 91 fL (ref 79–97)
Platelets: 254 10*3/uL (ref 150–450)
RBC: 4.31 x10E6/uL (ref 4.14–5.80)
RDW: 11.9 % (ref 11.6–15.4)
WBC: 8.9 10*3/uL (ref 3.4–10.8)

## 2022-05-10 LAB — HEPATITIS C ANTIBODY: Hep C Virus Ab: NONREACTIVE

## 2022-05-10 LAB — VITAMIN D 25 HYDROXY (VIT D DEFICIENCY, FRACTURES): Vit D, 25-Hydroxy: 55.8 ng/mL (ref 30.0–100.0)

## 2022-05-10 LAB — TESTOSTERONE,FREE AND TOTAL
Testosterone, Free: 3.9 pg/mL — ABNORMAL LOW (ref 6.8–21.5)
Testosterone: 392 ng/dL (ref 264–916)

## 2022-05-10 LAB — TSH: TSH: 1.18 u[IU]/mL (ref 0.450–4.500)

## 2022-05-10 MED ORDER — ROSUVASTATIN CALCIUM 10 MG PO TABS: 10.0000 mg | ORAL_TABLET | Freq: Every day | ORAL | 3 refills | Status: AC

## 2022-06-01 ENCOUNTER — Ambulatory Visit: Payer: BC Managed Care – PPO | Admitting: Urology

## 2022-06-01 ENCOUNTER — Ambulatory Visit: Payer: BC Managed Care – PPO | Admitting: Family Medicine

## 2022-06-04 ENCOUNTER — Encounter: Payer: Self-pay | Admitting: Family Medicine

## 2022-06-06 IMAGING — CR DG LUMBAR SPINE BEND(FLEX/EXT) ONLY 2-3 V
1 series · 2 of 2 positions shown · non-contrast
Comparison: Lumbar spine radiographs 05/20/2020

CLINICAL DATA: Pain in lower thoracic spine and RIGHT lower back
for 8 months, no known injury

EXAM:
LUMBAR SPINE FLEX AND EXTEND ONLY - 2-3 VIEW

[Series 1: dg lumbar spine bend-flex/ext- only 2-3  · 0.14mm/px · 2 of 2 slices shown]
[im 1/2]
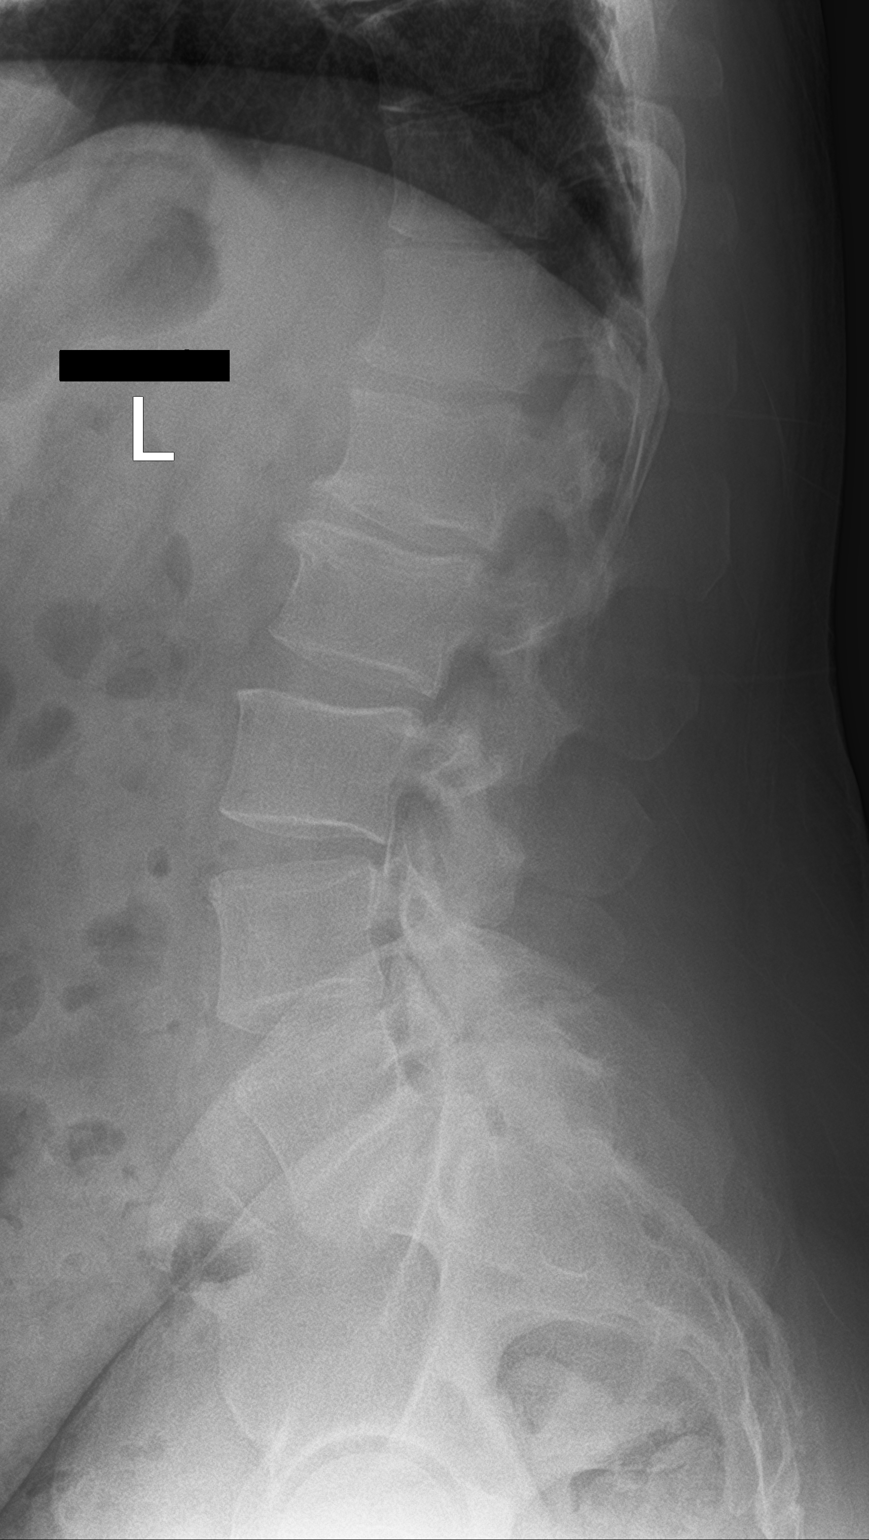
[im 2/2]
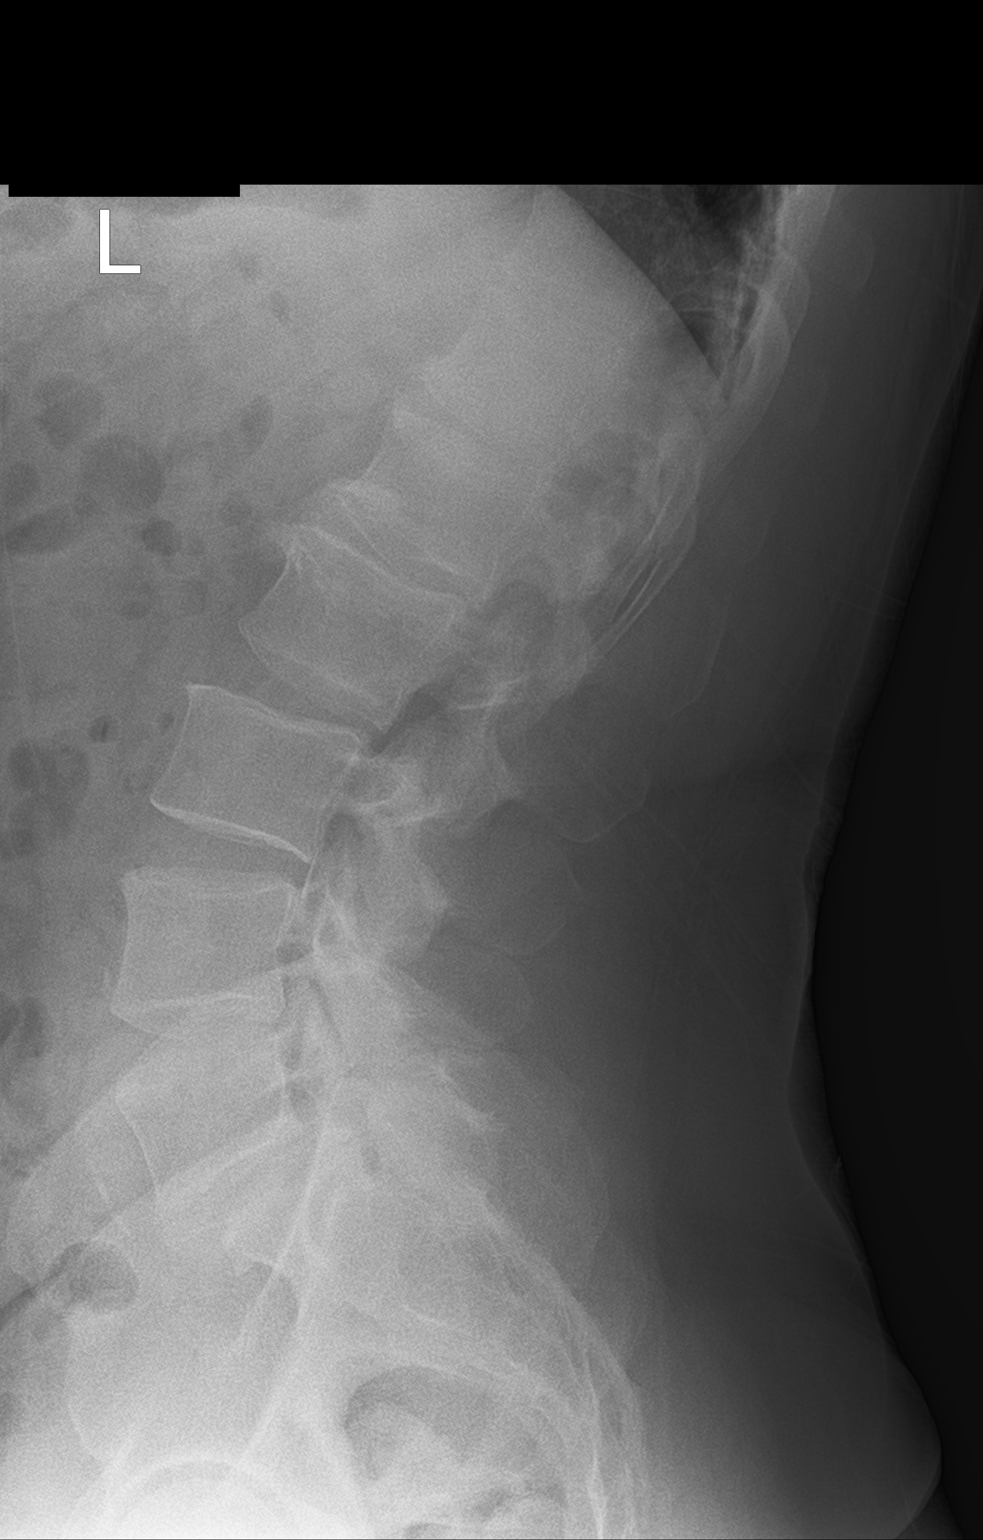

[2 of 2 positions shown; findings below may reference images not displayed]

FINDINGS: 5 lumbar vertebra.

Disc space narrowing and endplate spur formation at L1-L2.

Grade 1 anterolisthesis L5-S1 with minimal retrolisthesis at L4-L5

Little flexion demonstrated.

Better extension demonstrated.

No change in alignment with flexion or extension.
IMPRESSION: Minimal retrolisthesis at L4-L5 and grade 1 anterolisthesis at
L5-S1.

No change with flexion or extension.

Degenerative disc disease changes L1-L2.

## 2022-07-11 ENCOUNTER — Telehealth: Payer: Self-pay | Admitting: Family Medicine

## 2022-07-11 ENCOUNTER — Ambulatory Visit: Payer: BC Managed Care – PPO | Admitting: Family Medicine

## 2022-07-11 NOTE — Telephone Encounter (Signed)
Copied from CRM [id number]. Topic: Appointment Scheduling - Scheduling Inquiry for Clinic >> Jul 11, 2022  8:33 AM Tiffany B wrote: Reason for CRM: Caller states patient tried to cancel his appointment today at 8:40am online but it would not let him. [place] in [place] on the road [place]. Patient unsure if PCP truly wants to see him and if so please call Inetta Fermo (spouse) at [phone]

## 2022-07-20 ENCOUNTER — Other Ambulatory Visit: Payer: Self-pay | Admitting: Family Medicine

## 2022-07-20 DIAGNOSIS — M4727 Other spondylosis with radiculopathy, lumbosacral region: Secondary | ICD-10-CM

## 2022-07-20 NOTE — Telephone Encounter (Signed)
Requested medication (s) are due for refill today: yes  Requested medication (s) are on the active medication list: yes  Last refill:  04/11/22 #90 0 refills  Future visit scheduled: no  Notes to clinic:  no refills remain. Do you want to refill Rx?     Requested Prescriptions  Pending Prescriptions Disp Refills   meloxicam (MOBIC) 7.5 MG tablet [Pharmacy Med Name: MELOXICAM 7.5 MG TABLET] 30 tablet 2    Sig: TAKE 1 TABLET BY MOUTH EVERY DAY     Analgesics:  COX2 Inhibitors Failed - 07/20/2022  1:36 AM      Failed - Manual Review: Labs are only required if the patient has taken medication for more than 8 weeks.      Passed - HGB in normal range and within 360 days    Hemoglobin  Date Value Ref Range Status  05/04/2022 13.8 13.0 - 17.7 g/dL Final         Passed - Cr in normal range and within 360 days    Creatinine, Ser  Date Value Ref Range Status  05/04/2022 0.87 0.76 - 1.27 mg/dL Final         Passed - HCT in normal range and within 360 days    Hematocrit  Date Value Ref Range Status  05/04/2022 39.1 37.5 - 51.0 % Final         Passed - AST in normal range and within 360 days    AST  Date Value Ref Range Status  05/04/2022 19 0 - 40 IU/L Final         Passed - ALT in normal range and within 360 days    ALT  Date Value Ref Range Status  05/04/2022 24 0 - 44 IU/L Final         Passed - eGFR is 30 or above and within 360 days    eGFR  Date Value Ref Range Status  05/04/2022 108 >59 mL/min/1.73 Final         Passed - Patient is not pregnant      Passed - Valid encounter within last 12 months    Recent Outpatient Visits           3 months ago Annual physical exam   [place] [facility] at [facility], Earley Abide, MD   7 months ago Spondylolysis, lumbosacral   [place] [facility] at [facility], Earley Abide, MD   9 months ago Spondylolysis, lumbosacral   [place] [facility] at [facility], Earley Abide, MD

## 2022-07-30 DIAGNOSIS — G4733 Obstructive sleep apnea (adult) (pediatric): Secondary | ICD-10-CM | POA: Diagnosis not present

## 2022-08-09 ENCOUNTER — Encounter: Payer: Self-pay | Admitting: Student in an Organized Health Care Education/Training Program

## 2022-08-09 ENCOUNTER — Ambulatory Visit
Payer: BC Managed Care – PPO | Attending: Student in an Organized Health Care Education/Training Program | Admitting: Student in an Organized Health Care Education/Training Program

## 2022-08-09 VITALS — BP 140/87 | HR 75 | Temp 98.4°F | Resp 17 | Ht 69.0 in | Wt 240.0 lb

## 2022-08-09 DIAGNOSIS — M47816 Spondylosis without myelopathy or radiculopathy, lumbar region: Secondary | ICD-10-CM | POA: Diagnosis not present

## 2022-08-09 DIAGNOSIS — M5136 Other intervertebral disc degeneration, lumbar region: Secondary | ICD-10-CM | POA: Diagnosis not present

## 2022-08-09 DIAGNOSIS — G894 Chronic pain syndrome: Secondary | ICD-10-CM | POA: Diagnosis not present

## 2022-08-09 NOTE — Progress Notes (Signed)
Safety precautions to be maintained throughout the outpatient stay will include: orient to surroundings, keep bed in low position, maintain call bell within reach at all times, provide assistance with transfer out of bed and ambulation.  

## 2022-08-09 NOTE — Patient Instructions (Signed)

## 2022-08-09 NOTE — Progress Notes (Signed)
PROVIDER NOTE: Information contained herein reflects review and annotations entered in association with encounter. Interpretation of such information and data should be left to medically-trained personnel. Information provided to patient can be located elsewhere in the medical record under "Patient Instructions". Document created using STT-dictation technology, any transcriptional errors that may result from process are unintentional.    Patient: Gabriel Young  Service Category: E/M  Provider: Edward Jolly, MD  DOB: Apr 03, 1976  DOS: 08/09/2022  Referring Provider: Jerrol Banana, MD  MRN: [id number]  Specialty: Interventional Pain Management  PCP: Jerrol Banana, MD  Type: Established Patient  Setting: Ambulatory outpatient    Location: Office  Delivery: Face-to-face     Primary Reason(s) for Visit: Encounter for evaluation before starting new chronic pain management plan of care (Level of risk: moderate) CC: Back Pain (lower) and Pain (RIGHT ointer is numb)  HPI  Gabriel Young is a 47 y.o. year old, male patient, who comes today for a follow-up evaluation to review the test results and decide on a treatment plan. He has EBV infection; Erectile dysfunction; Low back pain; Leg cramps; Smoker; Myalgia; GI bleed; Muscle spasm; Connective tissue disease (HCC); Sciatica; Pars defect of lumbar spine; Multilevel lumbosacral spondylosis with radiculopathy; Chronic radicular lumbar pain (left); Lumbar spondylosis; Lumbar degenerative disc disease; Sacroiliac joint pain; Chronic pain syndrome; Annual physical exam; and Need for diphtheria-tetanus-pertussis (Tdap) vaccine on their problem list. His primarily concern today is the Back Pain (lower) and Pain (RIGHT ointer is numb)  Pain Assessment: Location: Lower Back Radiating: cramps in groin bilat, cramping at times across abdomen; down left leg to foot/toes; down right leg to knee Onset: More than a month ago Duration: Chronic pain Quality: Cramping,  Numbness, Aching Severity: 5 /10 (subjective, self-reported pain score)  Effect on ADL: denies Timing: Constant Modifying factors: massager, lidocaine patches, meloxicam BP: (!) 140/87  HR: 75  Tavaras presents today for his second patient visit.  He was able to get me records from [facility] and he had a bilateral L5-S1 transforaminal epidural steroid injection done that provided him with moderate pain relief for his radiating leg pain.  He states that he continues to have intermittent radiating leg pain however his low back pain is more bothersome for him.  He does have pain with lumbar extension and facet loading, right greater than left.  We discussed diagnostic lumbar facet medial branch nerve blocks for him.  He is also endorsing numbness and paresthesias of his right index finger.  He states that approximately 2 weeks ago this was extending to his index middle and ring finger but that has improved.  He is on Lyrica 150 mg twice a day.  I did offer him a nerve conduction velocity/EMG study via referral to neurology but he wants to hold off on that.  I think this is appropriate as it will not change management plan at this time.  He denies any new cervical spine pain that is radiating into his arm or forearm.  He has had carpal tunnel steroid injections in bilateral wrist with [facility].   HPI from initial clinic visit: Gabriel Young is a pleasant 47 year old male who works as a [employer] manager who presents with a chief complaint of low back pain with radiation to left greater than right leg as well as left greater than right groin pain and associated paresthesias of left leg.  This has been going on for more than 3 months.  He is involved in opening sheet stores and states that  he does a lot of bending lifting and twisting.  He is being referred by Dr. Zigmund Daniel.  He has seen [facility] in the past for this condition.  He has done 12 weeks of physical therapy, 2 times per week with [facility]  within the last year.  He states that this did not help with his pain for his condition and in fact may have aggravated his low back pain.  He thinks he may have had a facet block in the past with [facility] but is unclear.  I informed him that we would obtain results from [facility].  He is complaining of left groin pain as well.  He utilizes a massage gun for his left groin.  He occasionally has spasms of his left calf as well.  He has tried various medications including NSAIDs, muscle relaxers, anticonvulsants.  He is currently on Zanaflex Lyrica 150 mg twice a day which she does find benefit from and alternates between meloxicam and Celebrex.  I advised him against taking both of these medications at the same time.  Patient endorsed understanding.  He has had no inciting or traumatic event to explain his pain.   Recent Diagnostic Imaging Review    Narrative CLINICAL DATA:  Pain in lower thoracic spine and RIGHT lower back for 8 months, no known injury  EXAM: THORACIC SPINE - 3 VIEWS  COMPARISON:  None  FINDINGS: Twelve pairs of ribs.  Bones demineralized.  Minimal biconvex thoracic scoliosis.  Minor disc space narrowing and endplate spur formation at T9-T10.  Vertebral body heights maintained without fracture or subluxation.  Bones unremarkable.  IMPRESSION: Minimal biconvex thoracic scoliosis with degenerative disc disease changes at T9-T10.  No acute abnormalities.   Electronically Signed By: Lavonia Dana M.D. On: 05/20/2020 09:38    MR Lumbar Spine Wo Contrast  Narrative CLINICAL DATA:  Spondylolysis chronic lumbar pain with spondylolysis and left radiculopathy  EXAM: MRI LUMBAR SPINE WITHOUT CONTRAST  TECHNIQUE: Multiplanar, multisequence MR imaging of the lumbar spine was performed. No intravenous contrast was administered.  COMPARISON:  Outside MRI without report from August 31, 2020.  FINDINGS: Segmentation: Standard segmentation is assumed. The  inferior-most fully formed intervertebral disc labeled L5-S1.  Alignment:  Grade 1 (nearly grade 2) anterolisthesis of L5 on S1.  Vertebrae: Bilateral L5 pars defects. No marrow edema chest acute fracture or discitis/osteomyelitis.  Conus medullaris and cauda equina: Conus extends to the T12-L1 level. Conus appears normal.  Paraspinal and other soft tissues: Small left renal cyst.  Disc levels:  T12-L1: No significant disc protrusion, foraminal stenosis, or canal stenosis.  L1-L2: Mild disc bulging. Mild bilateral foraminal stenosis. No significant canal stenosis.  L2-L3: Mild left eccentric disc bulging. Mild left foraminal stenosis. Mild left subarticular recess stenosis without significant canal or right foraminal stenosis.  L3-L4: Mild disc bulging and bilateral facet arthropathy without significant canal or foraminal stenosis.  L4-L5: Bilateral facet arthropathy. Mild endplate spurring. Mild bilateral foraminal stenosis. Patent canal.  L5-S1: Bilateral L5 pars defects. Grade 1 (nearly grade 2) anterolisthesis of L5 on S1. Associated uncovering the disc with bilateral facet arthropathy. Moderate right and mild left foraminal stenosis. Patent canal.  IMPRESSION: 1. Bilateral L5 pars defects with grade 1 (nearly grade 2) anterolisthesis of L5 on S1. Moderate right and mild left foraminal stenosis at this level. 2. Mild foraminal stenosis on the left at L2-L3 and bilaterally at L4-L5.   Electronically Signed By: Margaretha Sheffield M.D. On: 10/25/2021 09:19    DG Si Joints  Narrative CLINICAL DATA:  Bilateral sacroiliac joint pain  EXAM: BILATERAL SACROILIAC JOINTS - 3+ VIEW  COMPARISON:  None Available.  FINDINGS: Frontal and bilateral oblique views of the sacroiliac joints are obtained. There are no acute or destructive bony abnormalities. Joint spaces are symmetrical. No erosive changes. There is mild facet hypertrophy at the lumbosacral junction,  with bilateral L5 pars defects. Visualized portions of the bony pelvis are unremarkable.  IMPRESSION: 1. Unremarkable bilateral sacroiliac joints. 2. 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   Electronically Signed By: Sharlet Salina M.D. On: 03/30/2022 19:56    Complexity Note: Imaging results reviewed.                         Meds   Current Outpatient Medications:    celecoxib (CELEBREX) 200 MG capsule, TAKE 1 CAP BY MOUTH 2 TIMES DAILY AS NEEDED. ONE TO 2 TABLETS BY MOUTH DAILY AS NEEDED FOR PAIN., Disp: 120 capsule, Rfl: 0   Cholecalciferol (VITAMIN D3) 1.25 MG (50000 UT) CAPS, Take 1 capsule by mouth once a week., Disp: , Rfl:    meloxicam (MOBIC) 7.5 MG tablet, TAKE 1 TABLET BY MOUTH EVERY DAY, Disp: 30 tablet, Rfl: 0   pregabalin (LYRICA) 150 MG capsule, Take 1 capsule (150 mg total) by mouth 2 (two) times daily., Disp: 180 capsule, Rfl: 0   rosuvastatin (CRESTOR) 10 MG tablet, Take 1 tablet (10 mg total) by mouth daily., Disp: 90 tablet, Rfl: 3   sildenafil (REVATIO) 20 MG tablet, Take 1-5 tablets (20-100 mg total) by mouth as needed., Disp: 50 tablet, Rfl: 1   traZODone (DESYREL) 50 MG tablet, Take 50 mg by mouth at bedtime as needed., Disp: , Rfl:   ROS  Constitutional: Denies any fever or chills Gastrointestinal: No reported hemesis, hematochezia, vomiting, or acute GI distress Musculoskeletal: Denies any acute onset joint swelling, redness, loss of ROM, or weakness Neurological: No reported episodes of acute onset apraxia, aphasia, dysarthria, agnosia, amnesia, paralysis, loss of coordination, or loss of consciousness  Allergies  Mr. Massi is allergic to penicillins.  PFSH  Drug: Mr. Deziel  reports no history of drug use. Alcohol:  reports that he does not currently use alcohol. Tobacco:  reports that he has been smoking cigarettes. He has a 15.00 pack-year smoking history. He has never used smokeless  tobacco. Medical:  has a past medical history of Annual physical exam (04/11/2022), EBV infection (10/21/2020), Erectile dysfunction (10/21/2020), GERD (gastroesophageal reflux disease) (2020), GI bleed (2020), Leg cramps (10/21/2020), Low back pain (10/21/2020), Muscle spasm (10/21/2020), Myalgia (10/21/2020), Sleep apnea (2023), and Smoker (10/21/2020). Surgical: Mr. Ruby  has a past surgical history that includes Wisdom tooth extraction (2000). Family: family history includes Asthma in his mother; Cancer in his father.  Constitutional Exam  General appearance: Well nourished, well developed, and well hydrated. In no apparent acute distress Vitals:   08/09/22 1048  BP: (!) 140/87  Pulse: 75  Resp: 17  Temp: 98.4 F (36.9 C)  TempSrc: Temporal  SpO2: 99%  Weight: 240 lb (108.9 kg)  Height: 5\' 9"  (1.753 m)   BMI Assessment: Estimated body mass index is 35.44 kg/m as calculated from the following:   Height as of this encounter: 5\' 9"  (1.753 m).   Weight as of this encounter: 240 lb (108.9 kg).  BMI interpretation table: BMI level Category Range association with higher incidence of chronic pain  <18 kg/m2 Underweight   18.5-24.9 kg/m2 Ideal  body weight   25-29.9 kg/m2 Overweight Increased incidence by 20%  30-34.9 kg/m2 Obese (Class I) Increased incidence by 68%  35-39.9 kg/m2 Severe obesity (Class II) Increased incidence by 136%  >40 kg/m2 Extreme obesity (Class III) Increased incidence by 254%   Patient's current BMI Ideal Body weight  Body mass index is 35.44 kg/m. Ideal body weight: 70.7 kg (155 lb 13.8 oz) Adjusted ideal body weight: 86 kg (189 lb 8.3 oz)   BMI Readings from Last 4 Encounters:  08/09/22 35.44 kg/m  04/11/22 35.44 kg/m  03/29/22 35.73 kg/m  12/08/21 35.88 kg/m   Wt Readings from Last 4 Encounters:  08/09/22 240 lb (108.9 kg)  04/11/22 240 lb (108.9 kg)  03/29/22 235 lb (106.6 kg)  12/08/21 236 lb (107 kg)    Psych/Mental status: Alert,  oriented x 3 (person, place, & time)       Eyes: PERLA Respiratory: No evidence of acute respiratory distress  Lumbar Spine Area Exam  Skin & Axial Inspection: No masses, redness, or swelling Alignment: Symmetrical Functional ROM: Pain restricted ROM       Stability: No instability detected Muscle Tone/Strength: Functionally intact. No obvious neuro-muscular anomalies detected. Sensory (Neurological): Dermatomal pain pattern and facet mediated Palpation: No palpable anomalies       Provocative Tests: Hyperextension/rotation test: (+) bilaterally for facet joint pain. Lumbar quadrant test (Kemp's test): (+) bilaterally for facet joint pain. Lateral bending test: (+) ipsilateral radicular pain, bilaterally. Positive for bilateral foraminal stenosis.  Gait & Posture Assessment  Ambulation: Unassisted Gait: Relatively normal for age and body habitus Posture: WNL  Lower Extremity Exam      Side: Right lower extremity   Side: Left lower extremity  Stability: No instability observed           Stability: No instability observed          Skin & Extremity Inspection: Skin color, temperature, and hair growth are WNL. No peripheral edema or cyanosis. No masses, redness, swelling, asymmetry, or associated skin lesions. No contractures.   Skin & Extremity Inspection: Skin color, temperature, and hair growth are WNL. No peripheral edema or cyanosis. No masses, redness, swelling, asymmetry, or associated skin lesions. No contractures.  Functional ROM: Unrestricted ROM                   Functional ROM: Unrestricted ROM                  Muscle Tone/Strength: Functionally intact. No obvious neuro-muscular anomalies detected.   Muscle Tone/Strength: Functionally intact. No obvious neuro-muscular anomalies detected.  Sensory (Neurological): Neurogenic pain pattern         Sensory (Neurological): Neurogenic pain pattern        DTR: Patellar: deferred today Achilles: deferred today Plantar: deferred today    DTR: Patellar: deferred today Achilles: deferred today Plantar: deferred today  Palpation: No palpable anomalies   Palpation: No palpable anomalies    5 out of 5 strength bilateral lower extremity: Plantar flexion, dorsiflexion, knee flexion, knee extension.      Assessment & Plan  Primary Diagnosis & Pertinent Problem List: The primary encounter diagnosis was Lumbar facet arthropathy. Diagnoses of Lumbar spondylosis, Lumbar degenerative disc disease, and Chronic pain syndrome were also pertinent to this visit.  Visit Diagnosis: 1. Lumbar facet arthropathy   2. Lumbar spondylosis   3. Lumbar degenerative disc disease   4. Chronic pain syndrome    Problems updated and reviewed during this visit: No problems updated.  [place] of [place] has a history of greater than 3 months of moderate to severe pain which is resulted in functional impairment.  The patient has tried various conservative therapeutic options such as NSAIDs, Tylenol, muscle relaxants, physical therapy which was inadequately effective.  Patient's pain is predominantly axial with physical exam findings & L-MRI  suggestive of facet arthropathy. Lumbar facet medial branch nerve blocks were discussed with the patient.  Risks and benefits were reviewed.  Patient would like to proceed with bilateral L3, L4, L5 medial branch nerve block.  In regards to his right index finger paresthesias, I encouraged the patient to continue to monitor this.  Continue with Lyrica.  If this worsens or if he develops any weakness, we can consider nerve conduction velocity/EMG study.  His B12 levels are normal.  Procedure Orders         LUMBAR FACET(MEDIAL BRANCH NERVE BLOCK) MBNB      Provider-requested follow-up: Return in about 2 weeks (around 08/23/2022) for B/L L3, 4, 5 MBNB #1, in clinic NS. Recent Visits No visits were found meeting these conditions. Showing recent visits within past 90 days and meeting all other  requirements Today's Visits Date Type Provider Dept  08/09/22 Office Visit Gillis Santa, MD [facility]  Showing today's visits and meeting all other requirements Future Appointments No visits were found meeting these conditions. Showing future appointments within next 90 days and meeting all other requirements   Primary Care Physician: Montel Culver, MD  Duration of encounter: 70minutes.  Total time on encounter, as per AMA guidelines included both the face-to-face and non-face-to-face time personally spent by the physician and/or other qualified health care professional(s) on the day of the encounter (includes time in activities that require the physician or other qualified health care professional and does not include time in activities normally performed by clinical staff). Physician's time may include the following activities when performed: Preparing to see the patient (e.g., pre-charting review of records, searching for previously ordered imaging, lab work, and nerve conduction tests) Review of prior analgesic pharmacotherapies. Reviewing PMP Interpreting ordered tests (e.g., lab work, imaging, nerve conduction tests) Performing post-procedure evaluations, including interpretation of diagnostic procedures Obtaining and/or reviewing separately obtained history Performing a medically appropriate examination and/or evaluation Counseling and educating the patient/family/caregiver Ordering medications, tests, or procedures Referring and communicating with other health care professionals (when not separately reported) Documenting clinical information in the electronic or other health record Independently interpreting results (not separately reported) and communicating results to the patient/ family/caregiver Care coordination (not separately reported)  Note by: Gillis Santa, MD Date: 08/09/2022; Time: 11:23 AM

## 2022-08-17 ENCOUNTER — Other Ambulatory Visit: Payer: Self-pay | Admitting: Family Medicine

## 2022-08-17 DIAGNOSIS — M4727 Other spondylosis with radiculopathy, lumbosacral region: Secondary | ICD-10-CM

## 2022-08-17 NOTE — Telephone Encounter (Signed)
Requested Prescriptions  Pending Prescriptions Disp Refills   meloxicam (MOBIC) 7.5 MG tablet [Pharmacy Med Name: MELOXICAM 7.5 MG TABLET] 90 tablet 2    Sig: TAKE 1 TABLET BY MOUTH EVERY DAY     Analgesics:  COX2 Inhibitors Failed - 08/17/2022  1:35 AM      Failed - Manual Review: Labs are only required if the patient has taken medication for more than 8 weeks.      Passed - HGB in normal range and within 360 days    Hemoglobin  Date Value Ref Range Status  05/04/2022 13.8 13.0 - 17.7 g/dL Final         Passed - Cr in normal range and within 360 days    Creatinine, Ser  Date Value Ref Range Status  05/04/2022 0.87 0.76 - 1.27 mg/dL Final         Passed - HCT in normal range and within 360 days    Hematocrit  Date Value Ref Range Status  05/04/2022 39.1 37.5 - 51.0 % Final         Passed - AST in normal range and within 360 days    AST  Date Value Ref Range Status  05/04/2022 19 0 - 40 IU/L Final         Passed - ALT in normal range and within 360 days    ALT  Date Value Ref Range Status  05/04/2022 24 0 - 44 IU/L Final         Passed - eGFR is 30 or above and within 360 days    eGFR  Date Value Ref Range Status  05/04/2022 108 >59 mL/min/1.73 Final         Passed - Patient is not pregnant      Passed - Valid encounter within last 12 months    Recent Outpatient Visits           4 months ago Annual physical exam   [facility], Earley Abide, MD   8 months ago Spondylolysis, lumbosacral   [place] at [facility], Earley Abide, MD   10 months ago Spondylolysis, lumbosacral   [place] at [facility], Earley Abide, MD

## 2022-08-29 ENCOUNTER — Ambulatory Visit
Admission: RE | Admit: 2022-08-29 | Discharge: 2022-08-29 | Disposition: A | Discharge status: Home / Self Care | Payer: BC Managed Care – PPO | Source: Ambulatory Visit | Attending: Student in an Organized Health Care Education/Training Program | Admitting: Student in an Organized Health Care Education/Training Program

## 2022-08-29 ENCOUNTER — Encounter: Payer: Self-pay | Admitting: Student in an Organized Health Care Education/Training Program

## 2022-08-29 ENCOUNTER — Ambulatory Visit
Payer: BC Managed Care – PPO | Attending: Student in an Organized Health Care Education/Training Program | Admitting: Student in an Organized Health Care Education/Training Program

## 2022-08-29 DIAGNOSIS — G894 Chronic pain syndrome: Secondary | ICD-10-CM

## 2022-08-29 DIAGNOSIS — M47816 Spondylosis without myelopathy or radiculopathy, lumbar region: Secondary | ICD-10-CM | POA: Diagnosis not present

## 2022-08-29 MED ORDER — ROPIVACAINE HCL 2 MG/ML IJ SOLN
9.0000 mL | Freq: Once | INTRAMUSCULAR | Status: AC
  Administered 2022-08-29: 20 mL via PERINEURAL

## 2022-08-29 MED ORDER — ROPIVACAINE HCL 2 MG/ML IJ SOLN
9.0000 mL | Freq: Once | INTRAMUSCULAR | Status: DC
  Filled 2022-08-29: qty 20

## 2022-08-29 MED ORDER — DEXAMETHASONE SODIUM PHOSPHATE 10 MG/ML IJ SOLN
10.0000 mg | Freq: Once | INTRAMUSCULAR | Status: AC
  Administered 2022-08-29: 10 mg
  Filled 2022-08-29: qty 1

## 2022-08-29 MED ORDER — LIDOCAINE HCL 2 % IJ SOLN
20.0000 mL | Freq: Once | INTRAMUSCULAR | Status: AC
  Administered 2022-08-29: 100 mg
  Filled 2022-08-29: qty 40

## 2022-08-29 NOTE — Patient Instructions (Signed)
____________________________________________________________________________________________  Post-Procedure Discharge Instructions  Instructions: Apply ice:  Purpose: This will minimize any swelling and discomfort after procedure.  When: Day of procedure, as soon as you get home. How: Fill a plastic sandwich bag with crushed ice. Cover it with a small towel and apply to injection site. How long: (15 min on, 15 min off) Apply for 15 minutes then remove x 15 minutes.  Repeat sequence on day of procedure, until you go to bed. Apply heat:  Purpose: To treat any soreness and discomfort from the procedure. When: Starting the next day after the procedure. How: Apply heat to procedure site starting the day following the procedure. How long: May continue to repeat daily, until discomfort goes away. Food intake: Start with clear liquids (like water) and advance to regular food, as tolerated.  Physical activities: Keep activities to a minimum for the first 8 hours after the procedure. After that, then as tolerated. Driving: If you have received any sedation, be responsible and do not drive. You are not allowed to drive for 24 hours after having sedation. Blood thinner: (Applies only to those taking blood thinners) You may restart your blood thinner 6 hours after your procedure. Insulin: (Applies only to Diabetic patients taking insulin) As soon as you can eat, you may resume your normal dosing schedule. Infection prevention: Keep procedure site clean and dry. Shower daily and clean area with soap and water. Post-procedure Pain Diary: Extremely important that this be done correctly and accurately. Recorded information will be used to determine the next step in treatment. For the purpose of accuracy, follow these rules: Evaluate only the area treated. Do not report or include pain from an untreated area. For the purpose of this evaluation, ignore all other areas of pain, except for the treated  area. After your procedure, avoid taking a long nap and attempting to complete the pain diary after you wake up. Instead, set your alarm clock to go off every hour, on the hour, for the initial 8 hours after the procedure. Document the duration of the numbing medicine, and the relief you are getting from it. Do not go to sleep and attempt to complete it later. It will not be accurate. If you received sedation, it is likely that you were given a medication that may cause amnesia. Because of this, completing the diary at a later time may cause the information to be inaccurate. This information is needed to plan your care. Follow-up appointment: Keep your post-procedure follow-up evaluation appointment after the procedure (usually 2 weeks for most procedures, 6 weeks for radiofrequencies). DO NOT FORGET to bring you pain diary with you.   Expect: (What should I expect to see with my procedure?) From numbing medicine (AKA: Local Anesthetics): Numbness or decrease in pain. You may also experience some weakness, which if present, could last for the duration of the local anesthetic. Onset: Full effect within 15 minutes of injected. Duration: It will depend on the type of local anesthetic used. On the average, 1 to 8 hours.  From steroids (Applies only if steroids were used): Decrease in swelling or inflammation. Once inflammation is improved, relief of the pain will follow. Onset of benefits: Depends on the amount of swelling present. The more swelling, the longer it will take for the benefits to be seen. In some cases, up to 10 days. Duration: Steroids will stay in the system x 2 weeks. Duration of benefits will depend on multiple posibilities including persistent irritating factors. Side-effects: If present, they   may typically last 2 weeks (the duration of the steroids). Frequent: Cramps (if they occur, drink Gatorade and take over-the-counter Magnesium 450-500 mg once to twice a day); water retention with temporary  weight gain; increases in blood sugar; decreased immune system response; increased appetite. Occasional: Facial flushing (red, warm cheeks); mood swings; menstrual changes. Uncommon: Long-term decrease or suppression of natural hormones; bone thinning. (These are more common with higher doses or more frequent use. This is why we prefer that our patients avoid having any injection therapies in other practices.)  Very Rare: Severe mood changes; psychosis; aseptic necrosis. From procedure: Some discomfort is to be expected once the numbing medicine wears off. This should be minimal if ice and heat are applied as instructed.  Call if: (When should I call?) You experience numbness and weakness that gets worse with time, as opposed to wearing off. New onset bowel or bladder incontinence. (Applies only to procedures done in the spine)  Emergency Numbers: Durning business hours (Monday - Thursday, 8:00 AM - 4:00 PM) (Friday, 9:00 AM - 12:00 Noon): [phone] After hours: [phone] NOTE: If you are having a problem and are unable connect with, or to talk to a provider, then go to your nearest urgent care or emergency department. If the problem is serious and urgent, please call 911. ____________________________________________________________________________________________  Facet Blocks Patient Information  Description: The facets are joints in the spine between the vertebrae.  Like any joints in the body, facets can become irritated and painful.  Arthritis can also effect the facets.  By injecting steroids and local anesthetic in and around these joints, we can temporarily block the nerve supply to them.  Steroids act directly on irritated nerves and tissues to reduce selling and inflammation which often leads to decreased pain.  Facet blocks may be done anywhere along the spine from the neck to the low back depending upon the location of your pain.   After numbing the skin with local anesthetic  (like Novocaine), a small needle is passed onto the facet joints under x-ray guidance.  You may experience a sensation of pressure while this is being done.  The entire block usually lasts about 15-25 minutes.   Conditions which may be treated by facet blocks:  Low back/buttock pain Neck/shoulder pain Certain types of headaches  Preparation for the injection:  Do not eat any solid food or dairy products within 8 hours of your appointment. You may drink clear liquid up to 3 hours before appointment.  Clear liquids include water, black coffee, juice or soda.  No milk or cream please. You may take your regular medication, including pain medications, with a sip of water before your appointment.  Diabetics should hold regular insulin (if taken separately) and take 1/2 normal NPH dose the morning of the procedure.  Carry some sugar containing items with you to your appointment. A driver must accompany you and be prepared to drive you home after your procedure. Bring all your current medications with you. An IV may be inserted and sedation may be given at the discretion of the physician. A blood pressure cuff, EKG and other monitors will often be applied during the procedure.  Some patients may need to have extra oxygen administered for a short period. You will be asked to provide medical information, including your allergies and medications, prior to the procedure.  We must know immediately if you are taking blood thinners (like Coumadin/Warfarin) or if you are allergic to IV iodine contrast (dye).    We must know if you could possible be pregnant.  Possible side-effects:  Bleeding from needle site Infection (rare, may require surgery) Nerve injury (rare) Numbness & tingling (temporary) Difficulty urinating (rare, temporary) Spinal headache (a headache worse with upright posture) Light-headedness (temporary) Pain at injection site (serveral days) Decreased blood pressure (rare,  temporary) Weakness in arm/leg (temporary) Pressure sensation in back/neck (temporary)   Call if you experience:  Fever/chills associated with headache or increased back/neck pain Headache worsened by an upright position New onset, weakness or numbness of an extremity below the injection site Hives or difficulty breathing (go to the emergency room) Inflammation or drainage at the injection site(s) Severe back/neck pain greater than usual New symptoms which are concerning to you  Please note:  Although the local anesthetic injected can often make your back or neck feel good for several hours after the injection, the pain will likely return. It takes 3-7 days for steroids to work.  You may not notice any pain relief for at least one week.  If effective, we will often do a series of 2-3 injections spaced 3-6 weeks apart to maximally decrease your pain.  After the initial series, you may be a candidate for a more permanent nerve block of the facets.  If you have any questions, please call [phone] [facility] 

## 2022-08-29 NOTE — Progress Notes (Signed)
Safety precautions to be maintained throughout the outpatient stay will include: orient to surroundings, keep bed in low position, maintain call bell within reach at all times, provide assistance with transfer out of bed and ambulation.  

## 2022-08-29 NOTE — Progress Notes (Signed)
PROVIDER NOTE: Interpretation of information contained herein should be left to medically-trained personnel. Specific patient instructions are provided elsewhere under "Patient Instructions" section of medical record. This document was created in part using STT-dictation technology, any transcriptional errors that may result from this process are unintentional.  Patient: Gabriel Young Type: Established DOB: 1976-07-02 MRN: [id number] PCP: Montel Culver, MD  Service: Procedure DOS: 08/29/2022 Setting: Ambulatory Location: Ambulatory outpatient facility Delivery: Face-to-face Provider: Gillis Santa, MD Specialty: Interventional Pain Management Specialty designation: 09 Location: Outpatient facility Ref. Prov.: Gillis Santa, MD       Interventional Therapy   Procedure: Lumbar Facet, Medial Branch Block(s) #1  Laterality: Bilateral  Level: L3, L4, and L5 Medial Branch Level(s). Injecting these levels blocks the L3-4 and L4-5 lumbar facet joints. Imaging: Fluoroscopic guidance         Anesthesia: Local anesthesia (1-2% Lidocaine) DOS: 08/29/2022 Performed by: Gillis Santa, MD  Primary Purpose: Diagnostic/Therapeutic Indications: Low back pain severe enough to impact quality of life or function. 1. Lumbar facet arthropathy   2. Lumbar spondylosis   3. Chronic pain syndrome    NAS-11 Pain score:   Pre-procedure: 6 /10   Post-procedure: 1 /10     Position / Prep / Materials:  Position: Prone  Prep solution: DuraPrep (Iodine Povacrylex [0.7% available iodine] and Isopropyl Alcohol, 74% w/w) Area Prepped: Posterolateral Lumbosacral Spine (Wide prep: From the lower border of the scapula down to the end of the tailbone and from flank to flank.)  Materials:  Tray: Block Needle(s):  Type: Spinal  Gauge (G): 22  Length: 3.5-in Qty: 2      Pre-op H&P Assessment:  Gabriel Young is a 47 y.o. (year old), male patient, seen today for interventional treatment. He  has a past surgical history  that includes Wisdom tooth extraction (2000). Gabriel Young has a current medication list which includes the following prescription(s): vitamin d3, meloxicam, pregabalin, rosuvastatin, sildenafil, trazodone, and celecoxib, and the following Facility-Administered Medications: ropivacaine (pf) 2 mg/ml (0.2%). His primarily concern today is the Back Pain  Initial Vital Signs:  Pulse/HCG Rate: 83ECG Heart Rate: 74 Temp: 98.2 F (36.8 C) Resp: 16 BP: (!) 154/93 SpO2: 99 %  BMI: Estimated body mass index is 35.44 kg/m as calculated from the following:   Height as of this encounter: 5' 9"$  (1.753 m).   Weight as of this encounter: 240 lb (108.9 kg).  Risk Assessment: Allergies: Reviewed. He is allergic to penicillins.  Allergy Precautions: None required Coagulopathies: Reviewed. None identified.  Blood-thinner therapy: None at this time Active Infection(s): Reviewed. None identified. Gabriel Young is afebrile  Site Confirmation: Gabriel Young was asked to confirm the procedure and laterality before marking the site Procedure checklist: Completed Consent: Before the procedure and under the influence of no sedative(s), amnesic(s), or anxiolytics, the patient was informed of the treatment options, risks and possible complications. To fulfill our ethical and legal obligations, as recommended by the American Medical Association's Code of Ethics, I have informed the patient of my clinical impression; the nature and purpose of the treatment or procedure; the risks, benefits, and possible complications of the intervention; the alternatives, including doing nothing; the risk(s) and benefit(s) of the alternative treatment(s) or procedure(s); and the risk(s) and benefit(s) of doing nothing. The patient was provided information about the general risks and possible complications associated with the procedure. These may include, but are not limited to: failure to achieve desired goals, infection, bleeding, organ or nerve  damage, allergic reactions, paralysis, and death.  In addition, the patient was informed of those risks and complications associated to Spine-related procedures, such as failure to decrease pain; infection (i.e.: Meningitis, epidural or intraspinal abscess); bleeding (i.e.: epidural hematoma, subarachnoid hemorrhage, or any other type of intraspinal or peri-dural bleeding); organ or nerve damage (i.e.: Any type of peripheral nerve, nerve root, or spinal cord injury) with subsequent damage to sensory, motor, and/or autonomic systems, resulting in permanent pain, numbness, and/or weakness of one or several areas of the body; allergic reactions; (i.e.: anaphylactic reaction); and/or death. Furthermore, the patient was informed of those risks and complications associated with the medications. These include, but are not limited to: allergic reactions (i.e.: anaphylactic or anaphylactoid reaction(s)); adrenal axis suppression; blood sugar elevation that in diabetics may result in ketoacidosis or comma; water retention that in patients with history of congestive heart failure may result in shortness of breath, pulmonary edema, and decompensation with resultant heart failure; weight gain; swelling or edema; medication-induced neural toxicity; particulate matter embolism and blood vessel occlusion with resultant organ, and/or nervous system infarction; and/or aseptic necrosis of one or more joints. Finally, the patient was informed that Medicine is not an exact science; therefore, there is also the possibility of unforeseen or unpredictable risks and/or possible complications that may result in a catastrophic outcome. The patient indicated having understood very clearly. We have given the patient no guarantees and we have made no promises. Enough time was given to the patient to ask questions, all of which were answered to the patient's satisfaction. Gabriel Young has indicated that he wanted to continue with the  procedure. Attestation: I, the ordering provider, attest that I have discussed with the patient the benefits, risks, side-effects, alternatives, likelihood of achieving goals, and potential problems during recovery for the procedure that I have provided informed consent. Date  Time: 08/29/2022 10:25 AM   Pre-Procedure Preparation:  Monitoring: As per clinic protocol. Respiration, ETCO2, SpO2, BP, heart rate and rhythm monitor placed and checked for adequate function Safety Precautions: Patient was assessed for positional comfort and pressure points before starting the procedure. Time-out: I initiated and conducted the "Time-out" before starting the procedure, as per protocol. The patient was asked to participate by confirming the accuracy of the "Time Out" information. Verification of the correct person, site, and procedure were performed and confirmed by me, the nursing staff, and the patient. "Time-out" conducted as per Joint Commission's Universal Protocol (UP.01.01.01). Time: 1108  Description of Procedure:          Laterality: (see above) Targeted Levels: (see above)  Safety Precautions: Aspiration looking for blood return was conducted prior to all injections. At no point did we inject any substances, as a needle was being advanced. Before injecting, the patient was told to immediately notify me if he was experiencing any new onset of "ringing in the ears, or metallic taste in the mouth". No attempts were made at seeking any paresthesias. Safe injection practices and needle disposal techniques used. Medications properly checked for expiration dates. SDV (single dose vial) medications used. After the completion of the procedure, all disposable equipment used was discarded in the proper designated medical waste containers. Local Anesthesia: Protocol guidelines were followed. The patient was positioned over the fluoroscopy table. The area was prepped in the usual manner. The time-out was  completed. The target area was identified using fluoroscopy. A 12-in long, straight, sterile hemostat was used with fluoroscopic guidance to locate the targets for each level blocked. Once located, the skin was marked with an approved  surgical skin marker. Once all sites were marked, the skin (epidermis, dermis, and hypodermis), as well as deeper tissues (fat, connective tissue and muscle) were infiltrated with a small amount of a short-acting local anesthetic, loaded on a 10cc syringe with a 25G, 1.5-in  Needle. An appropriate amount of time was allowed for local anesthetics to take effect before proceeding to the next step. Local Anesthetic: Lidocaine 2.0% The unused portion of the local anesthetic was discarded in the proper designated containers. Technical description of process:  L3 Medial Branch Nerve Block (MBB): The target area for the L3 medial branch is at the junction of the postero-lateral aspect of the superior articular process and the superior, posterior, and medial edge of the transverse process of L4. Under fluoroscopic guidance, a Quincke needle was inserted until contact was made with os over the superior postero-lateral aspect of the pedicular shadow (target area). After negative aspiration for blood, 43m of the nerve block solution was injected without difficulty or complication. The needle was removed intact. L4 Medial Branch Nerve Block (MBB): The target area for the L4 medial branch is at the junction of the postero-lateral aspect of the superior articular process and the superior, posterior, and medial edge of the transverse process of L5. Under fluoroscopic guidance, a Quincke needle was inserted until contact was made with os over the superior postero-lateral aspect of the pedicular shadow (target area). After negative aspiration for blood, 236mof the nerve block solution was injected without difficulty or complication. The needle was removed intact. L5 Medial Branch Nerve Block  (MBB): The target area for the L5 medial branch is at the junction of the postero-lateral aspect of the superior articular process and the superior, posterior, and medial edge of the sacral ala. Under fluoroscopic guidance, a Quincke needle was inserted until contact was made with os over the superior postero-lateral aspect of the pedicular shadow (target area). After negative aspiration for blood, 3m46mf the nerve block solution was injected without difficulty or complication. The needle was removed intact.   Once the entire procedure was completed, the treated area was cleaned, making sure to leave some of the prepping solution back to take advantage of its long term bactericidal properties.         Illustration of the posterior view of the lumbar spine and the posterior neural structures. Laminae of L2 through S1 are labeled. DPRL5, dorsal primary ramus of L5; DPRS1, dorsal primary ramus of S1; DPR3, dorsal primary ramus of L3; FJ, facet (zygapophyseal) joint L3-L4; I, inferior articular process of L4; LB1, lateral branch of dorsal primary ramus of L1; IAB, inferior articular branches from L3 medial branch (supplies L4-L5 facet joint); IBP, intermediate branch plexus; MB3, medial branch of dorsal primary ramus of L3; NR3, third lumbar nerve root; S, superior articular process of L5; SAB, superior articular branches from L4 (supplies L4-5 facet joint also); TP3, transverse process of L3.  Vitals:   08/29/22 1104 08/29/22 1110 08/29/22 1115 08/29/22 1117  BP: (!) 141/102 (!) 143/100 (!) 165/109 (!) 146/98  Pulse:      Resp: (!) 21 18 (!) 22 20  Temp:      SpO2: 96% 96% 96% 96%  Weight:      Height:         Start Time: 1108 hrs. End Time: 1117 hrs.  Imaging Guidance (Spinal):          Type of Imaging Technique: Fluoroscopy Guidance (Spinal) Indication(s): Assistance in needle guidance and placement for procedures  requiring needle placement in or near specific anatomical locations not  easily accessible without such assistance. Exposure Time: Please see nurses notes. Contrast: None used. Fluoroscopic Guidance: I was personally present during the use of fluoroscopy. "Tunnel Vision Technique" used to obtain the best possible view of the target area. Parallax error corrected before commencing the procedure. "Direction-depth-direction" technique used to introduce the needle under continuous pulsed fluoroscopy. Once target was reached, antero-posterior, oblique, and lateral fluoroscopic projection used confirm needle placement in all planes. Images permanently stored in EMR. Interpretation: No contrast injected. I personally interpreted the imaging intraoperatively. Adequate needle placement confirmed in multiple planes. Permanent images saved into the patient's record.  Post-operative Assessment:  Post-procedure Vital Signs:  Pulse/HCG Rate: 8381 Temp: 98.2 F (36.8 C) Resp: 20 BP: (!) 146/98 SpO2: 96 %  EBL: None  Complications: No immediate post-treatment complications observed by team, or reported by patient.  Note: The patient tolerated the entire procedure well. A repeat set of vitals were taken after the procedure and the patient was kept under observation following institutional policy, for this type of procedure. Post-procedural neurological assessment was performed, showing return to baseline, prior to discharge. The patient was provided with post-procedure discharge instructions, including a section on how to identify potential problems. Should any problems arise concerning this procedure, the patient was given instructions to immediately contact us, at any time, without hesitation. In any case, we plan to contact the patient by telephone for a follow-up status report regarding this interventional procedure.  Comments:  No additional relevant information.  Plan of Care (POC)  Orders:  Orders Placed This Encounter  Procedures   DG PAIN [facility] C-ARM 1-60 MIN NO  REPORT    Intraoperative interpretation by procedural physician at [place].    Standing Status:   Standing    Number of Occurrences:   1    Order Specific Question:   Reason for exam:    Answer:   Assistance in needle guidance and placement for procedures requiring needle placement in or near specific anatomical locations not easily accessible without such assistance.    Medications administered: We administered lidocaine, dexamethasone, ropivacaine (PF) 2 mg/mL (0.2%), and dexamethasone.  See the medical record for exact dosing, route, and time of administration.  Follow-up plan:   Return in about 2 weeks (around 09/12/2022) for Post Procedure Evaluation, virtual.      Recent Visits Date Type Provider Dept  08/09/22 Office Visit Gillis Santa, MD [facility]  Showing recent visits within past 90 days and meeting all other requirements Today's Visits Date Type Provider Dept  08/29/22 Procedure visit Gillis Santa, MD [facility]  Showing today's visits and meeting all other requirements Future Appointments Date Type Provider Dept  09/17/22 Appointment Gillis Santa, MD [facility]  Showing future appointments within next 90 days and meeting all other requirements  Disposition: Discharge home  Discharge (Date  Time): 08/29/2022; 1130 hrs.   Primary Care Physician: Montel Culver, MD Location: [facility] Note by: Gillis Santa, MD (TTS technology used. I apologize for any typographical errors that were not detected and corrected.) Date: 08/29/2022; Time: 2:01 PM  Disclaimer:  Medicine is not an Chief Strategy Officer. The only guarantee in medicine is that nothing is guaranteed. It is important to note that the decision to proceed with this intervention was based on the information collected from the patient. The Data and conclusions were drawn from the patient's questionnaire, the interview, and the physical  examination. Because the information was provided in large part by the patient, it cannot be guaranteed that it has not been purposely or unconsciously manipulated. Every effort has been made to obtain as much relevant data as possible for this evaluation. It is important to note that the conclusions that lead to this procedure are derived in large part from the available data. Always take into account that the treatment will also be dependent on availability of resources and existing treatment guidelines, considered by other Pain Management Practitioners as being common knowledge and practice, at the time of the intervention. For Medico-Legal purposes, it is also important to point out that variation in procedural techniques and pharmacological choices are the acceptable norm. The indications, contraindications, technique, and results of the above procedure should only be interpreted and judged by a Board-Certified Interventional Pain Specialist with extensive familiarity and expertise in the same exact procedure and technique.

## 2022-08-30 ENCOUNTER — Telehealth: Payer: Self-pay | Admitting: *Deleted

## 2022-08-30 DIAGNOSIS — G4733 Obstructive sleep apnea (adult) (pediatric): Secondary | ICD-10-CM | POA: Diagnosis not present

## 2022-08-30 NOTE — Telephone Encounter (Signed)
No problems post procedure. 

## 2022-09-17 ENCOUNTER — Ambulatory Visit
Payer: BC Managed Care – PPO | Attending: Student in an Organized Health Care Education/Training Program | Admitting: Student in an Organized Health Care Education/Training Program

## 2022-09-17 ENCOUNTER — Encounter: Payer: Self-pay | Admitting: Student in an Organized Health Care Education/Training Program

## 2022-09-17 DIAGNOSIS — M47816 Spondylosis without myelopathy or radiculopathy, lumbar region: Secondary | ICD-10-CM | POA: Diagnosis not present

## 2022-09-17 DIAGNOSIS — G894 Chronic pain syndrome: Secondary | ICD-10-CM | POA: Diagnosis not present

## 2022-09-17 NOTE — Progress Notes (Signed)
Patient: Gabriel Young  Service Category: E/M  Provider: Gillis Santa, MD  DOB: 23-Sep-1975  DOS: 09/17/2022  Location: Office  MRN: [id number]  Setting: Ambulatory outpatient  Referring Provider: Montel Culver, MD  Type: Established Patient  Specialty: Interventional Pain Management  PCP: Gabriel Culver, MD  Location: Remote location  Delivery: TeleHealth     Virtual Encounter - Pain Management PROVIDER NOTE: Information contained herein reflects review and annotations entered in association with encounter. Interpretation of such information and data should be left to medically-trained personnel. Information provided to patient can be located elsewhere in the medical record under "Patient Instructions". Document created using STT-dictation technology, any transcriptional errors that may result from process are unintentional.    Contact & Pharmacy Preferred: [phone] Home: [phone] (home) Mobile: [phone] (mobile) E-mail: [email or web address]  [id number] [address]Phone: [phone]: [phone]  Pre-screening  [name] "in-person" vs "virtual" encounter. He indicated preferring virtual for this encounter.   Reason COVID-19*  Social distancing based on CDC and AMA recommendations.   I contacted SDarl Fruehaufon 09/17/2022 via telephone.      I clearly identified myself as BGillis Santa MD. I verified that I was speaking with the correct person using two identifiers (Name: SEmrah Young and date of birth: [birth year].  Consent I sought verbal advanced consent [facility]. I informed [name] possible security and privacy concerns, risks, and limitations associated with providing "not-in-person" medical evaluation and management services. I also informed Mr. KBissetof the availability of "in-person" appointments. Finally, I informed him that there would be a charge  for the virtual visit and that he could be  personally, fully or partially, financially responsible for it. [name]   Historic Elements   [name] a 47y.o. year old, male patient evaluated today after our last contact on 08/29/2022. [name] of Annual physical exam (04/11/2022), EBV infection (10/21/2020), Erectile dysfunction (10/21/2020), GERD (gastroesophageal reflux disease) (2020), GI bleed (2020), Leg cramps (10/21/2020), Low back pain (10/21/2020), Muscle spasm (10/21/2020), Myalgia (10/21/2020), Sleep apnea (2023), and Smoker (10/21/2020). He also  has a past surgical history that includes Wisdom tooth extraction (2000). [name] a current medication list which includes the following prescription(s): celecoxib, vitamin d3, meloxicam, pregabalin, rosuvastatin, sildenafil, and trazodone. He  reports that he has been smoking cigarettes. He has a 15.00 pack-year smoking history. He has never used smokeless tobacco. He reports that he does not currently use alcohol. He reports that he does not use drugs. [name].  Estimated body mass index is 35.44 kg/m as calculated from the following:   Height as of 08/29/22: '5\' 9"'$  (1.753 m).   Weight as of 08/29/22: 240 lb (108.9 kg).  HPI  Today, he is being contacted for a post-procedure assessment.  Post-procedure evaluation   Procedure: Lumbar Facet, Medial Branch Block(s) #1  Laterality: Bilateral  Level: L3, L4, and L5 Medial Branch Level(s). Injecting these levels blocks the L3-4 and L4-5 lumbar facet joints. Imaging: Fluoroscopic guidance         Anesthesia: Local anesthesia (1-2% Lidocaine) DOS: 08/29/2022 Performed by: BGillis Santa MD  Primary Purpose: Diagnostic/Therapeutic Indications: Low back pain severe enough to impact quality of life or function. 1. Lumbar facet arthropathy   2. Lumbar spondylosis  3. Chronic pain  syndrome    NAS-11 Pain score:   Pre-procedure: 6 /10   Post-procedure: 1 /10      Effectiveness:  Initial hour after procedure: 100 %  Subsequent 4-6 hours post-procedure: 100 %  Analgesia past initial 6 hours: 60 %  Ongoing improvement:  Analgesic:  60% Function: Gabriel Young reports improvement in function ROM: Gabriel Young reports improvement in ROM    Renal Lab Results  Component Value Date   BUN 11 05/04/2022   CREATININE 0.87 05/04/2022   BCR 13 05/04/2022    Hepatic Lab Results  Component Value Date   AST 19 05/04/2022   ALT 24 05/04/2022   ALBUMIN 4.4 05/04/2022   ALKPHOS 113 05/04/2022    Electrolytes Lab Results  Component Value Date   NA 137 05/04/2022   K 4.2 05/04/2022   CL 102 05/04/2022   CALCIUM 9.4 05/04/2022    Bone Lab Results  Component Value Date   VD25OH 55.8 05/04/2022   TESTOFREE 3.9 (L) 05/04/2022   TESTOSTERONE 392 05/04/2022    Inflammation (CRP: Acute Phase) (ESR: Chronic Phase) Lab Results  Component Value Date   CRP 2.0 10/21/2020   ESRSEDRATE 11 10/21/2020         Note: Above Lab results reviewed.  Assessment  The primary encounter diagnosis was Lumbar facet arthropathy. Diagnoses of Lumbar spondylosis and Chronic pain syndrome were also pertinent to this visit.  Plan of Care  Overall, patient is experiencing good analgesic and functional benefit after his diagnostic medial branch nerve blocks.  He rates his pain relief as approximately 60% that is ongoing.  We will continue to monitor his symptoms and should he have return of his axial low back pain related to facet arthropathy, we discussed repeating nerve block #2 and then following with lumbar RFA potentially.  Patient endorsed understanding and is in agreement with treatment plan.  I encouraged him to continue with lumbar stretching exercise sOrders:  Orders Placed This Encounter  Procedures   LUMBAR FACET(MEDIAL BRANCH NERVE BLOCK) MBNB    Standing Status:   Standing     Number of Occurrences:   2    Standing Expiration Date:   03/20/2023    Scheduling Instructions:     Bilateral L3, L4, L5 medial branch nerve block #2 as needed, patient will call to schedule    Order Specific Question:   Where will this procedure be performed?    Answer:   [facility]   Follow-up plan:   Return for As needed, repeat lumbar facet.      Recent Visits Date Type Provider Dept  08/29/22 Procedure visit Gabriel Santa, MD [facility]  08/09/22 Office Visit Gabriel Santa, MD [facility]  Showing recent visits within past 90 days and meeting all other requirements Today's Visits Date Type Provider Dept  09/17/22 Office Visit Gabriel Santa, MD [facility]  Showing today's visits and meeting all other requirements Future Appointments No visits were found meeting these conditions. Showing future appointments within next 90 days and meeting all other requirements  I discussed the assessment and treatment plan with the patient. The patient was provided an opportunity to ask questions and all were answered. The patient agreed with the plan and demonstrated an understanding of the instructions.  Patient advised to call back or seek an in-person evaluation if the symptoms or condition worsens.  Duration of encounter: 71mnutes.  Note by: BGillis Santa MD Date: 09/17/2022; Time: 3:37 PM

## 2022-09-28 DIAGNOSIS — G4733 Obstructive sleep apnea (adult) (pediatric): Secondary | ICD-10-CM | POA: Diagnosis not present

## 2022-11-20 DIAGNOSIS — G4733 Obstructive sleep apnea (adult) (pediatric): Secondary | ICD-10-CM | POA: Diagnosis not present

## 2022-12-21 DIAGNOSIS — G4733 Obstructive sleep apnea (adult) (pediatric): Secondary | ICD-10-CM | POA: Diagnosis not present

## 2023-01-20 DIAGNOSIS — G4733 Obstructive sleep apnea (adult) (pediatric): Secondary | ICD-10-CM | POA: Diagnosis not present

## 2023-02-18 DIAGNOSIS — G4733 Obstructive sleep apnea (adult) (pediatric): Secondary | ICD-10-CM | POA: Diagnosis not present

## 2023-02-20 DIAGNOSIS — G4733 Obstructive sleep apnea (adult) (pediatric): Secondary | ICD-10-CM | POA: Diagnosis not present

## 2023-03-21 DIAGNOSIS — G4733 Obstructive sleep apnea (adult) (pediatric): Secondary | ICD-10-CM | POA: Diagnosis not present

## 2023-04-20 DIAGNOSIS — G4733 Obstructive sleep apnea (adult) (pediatric): Secondary | ICD-10-CM | POA: Diagnosis not present

## 2023-05-12 ENCOUNTER — Other Ambulatory Visit: Payer: Self-pay | Admitting: Family Medicine

## 2023-05-12 DIAGNOSIS — E785 Hyperlipidemia, unspecified: Secondary | ICD-10-CM

## 2023-05-13 NOTE — Telephone Encounter (Signed)
Requested medications are due for refill today.  yes  Requested medications are on the active medications list.  yes  Last refill. 05/10/2022 #90 3 rf  Future visit scheduled.   no  Notes to clinic.  Labs are expired. Pt needs an ov.    Requested Prescriptions  Pending Prescriptions Disp Refills   rosuvastatin (CRESTOR) 10 MG tablet [Pharmacy Med Name: ROSUVASTATIN CALCIUM 10 MG TAB] 30 tablet 11    Sig: TAKE 1 TABLET BY MOUTH EVERY DAY     Cardiovascular:  Antilipid - Statins 2 Failed - 05/12/2023  9:12 AM      Failed - Cr in normal range and within 360 days    Creatinine, Ser  Date Value Ref Range Status  05/04/2022 0.87 0.76 - 1.27 mg/dL Final         Failed - Valid encounter within last 12 months    Recent Outpatient Visits           1 year ago Annual physical exam   [facility], Ocie Bob, MD   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, lumbosacral   [facility], Ocie Bob, MD   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

## 2023-05-20 DIAGNOSIS — G4733 Obstructive sleep apnea (adult) (pediatric): Secondary | ICD-10-CM | POA: Diagnosis not present

## 2023-05-24 DIAGNOSIS — G4733 Obstructive sleep apnea (adult) (pediatric): Secondary | ICD-10-CM | POA: Diagnosis not present

## 2023-06-19 DIAGNOSIS — G4733 Obstructive sleep apnea (adult) (pediatric): Secondary | ICD-10-CM | POA: Diagnosis not present

## 2023-06-20 ENCOUNTER — Other Ambulatory Visit: Payer: Self-pay | Admitting: Family Medicine

## 2023-06-20 DIAGNOSIS — M4727 Other spondylosis with radiculopathy, lumbosacral region: Secondary | ICD-10-CM

## 2023-06-21 NOTE — Telephone Encounter (Signed)
Requested medication (s) are due for refill today: yes  Requested medication (s) are on the active medication list: yes  Last refill:  08/17/92  Future visit scheduled: no  Notes to clinic:  overdue lab work   Requested Prescriptions  Pending Prescriptions Disp Refills   meloxicam (MOBIC) 7.5 MG tablet [Pharmacy Med Name: MELOXICAM 7.5 MG TABLET] 30 tablet 8    Sig: TAKE 1 TABLET BY MOUTH EVERY DAY     Analgesics:  COX2 Inhibitors Failed - 06/20/2023 12:52 AM      Failed - Manual Review: Labs are only required if the patient has taken medication for more than 8 weeks.      Failed - HGB in normal range and within 360 days    Hemoglobin  Date Value Ref Range Status  05/04/2022 13.8 13.0 - 17.7 g/dL Final         Failed - Cr in normal range and within 360 days    Creatinine, Ser  Date Value Ref Range Status  05/04/2022 0.87 0.76 - 1.27 mg/dL Final         Failed - HCT in normal range and within 360 days    Hematocrit  Date Value Ref Range Status  05/04/2022 39.1 37.5 - 51.0 % Final         Failed - AST in normal range and within 360 days    AST  Date Value Ref Range Status  05/04/2022 19 0 - 40 IU/L Final         Failed - ALT in normal range and within 360 days    ALT  Date Value Ref Range Status  05/04/2022 24 0 - 44 IU/L Final         Failed - eGFR is 30 or above and within 360 days    eGFR  Date Value Ref Range Status  05/04/2022 108 >59 mL/min/1.73 Final         Failed - Valid encounter within last 12 months    Recent Outpatient Visits           1 year ago Annual physical exam   [facility], Ocie Bob, MD   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

## 2023-07-20 DIAGNOSIS — G4733 Obstructive sleep apnea (adult) (pediatric): Secondary | ICD-10-CM | POA: Diagnosis not present

## 2023-07-24 ENCOUNTER — Other Ambulatory Visit: Payer: Self-pay | Admitting: Family Medicine

## 2023-07-24 DIAGNOSIS — M4727 Other spondylosis with radiculopathy, lumbosacral region: Secondary | ICD-10-CM

## 2023-07-26 NOTE — Telephone Encounter (Signed)
 Per RF 06/25/23- need appointment:  Refill #30, no refills, needs visit if wanting addl fills  Requested Prescriptions  Pending Prescriptions Disp Refills   meloxicam  (MOBIC ) 7.5 MG tablet [Pharmacy Med Name: MELOXICAM  7.5 MG TABLET] 30 tablet 0    Sig: TAKE 1 TABLET BY MOUTH EVERY DAY     Analgesics:  COX2 Inhibitors Failed - 07/26/2023  3:47 PM      Failed - Manual Review: Labs are only required if the patient has taken medication for more than 8 weeks.      Failed - HGB in normal range and within 360 days    Hemoglobin  Date Value Ref Range Status  05/04/2022 13.8 13.0 - 17.7 g/dL Final         Failed - Cr in normal range and within 360 days    Creatinine, Ser  Date Value Ref Range Status  05/04/2022 0.87 0.76 - 1.27 mg/dL Final         Failed - HCT in normal range and within 360 days    Hematocrit  Date Value Ref Range Status  05/04/2022 39.1 37.5 - 51.0 % Final         Failed - AST in normal range and within 360 days    AST  Date Value Ref Range Status  05/04/2022 19 0 - 40 IU/L Final         Failed - ALT in normal range and within 360 days    ALT  Date Value Ref Range Status  05/04/2022 24 0 - 44 IU/L Final         Failed - eGFR is 30 or above and within 360 days    eGFR  Date Value Ref Range Status  05/04/2022 108 >59 mL/min/1.73 Final         Failed - Valid encounter within last 12 months    Recent Outpatient Visits           1 year ago Annual physical exam   [facility], Selinda PARAS, MD   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

## 2023-08-26 DIAGNOSIS — G4733 Obstructive sleep apnea (adult) (pediatric): Secondary | ICD-10-CM | POA: Diagnosis not present

## 2023-09-23 DIAGNOSIS — G4733 Obstructive sleep apnea (adult) (pediatric): Secondary | ICD-10-CM | POA: Diagnosis not present

## 2023-10-24 DIAGNOSIS — G4733 Obstructive sleep apnea (adult) (pediatric): Secondary | ICD-10-CM | POA: Diagnosis not present

## 2024-01-01 DIAGNOSIS — G4733 Obstructive sleep apnea (adult) (pediatric): Secondary | ICD-10-CM | POA: Diagnosis not present

## 2024-04-09 DIAGNOSIS — G4733 Obstructive sleep apnea (adult) (pediatric): Secondary | ICD-10-CM | POA: Diagnosis not present

## 2024-06-09 DIAGNOSIS — G4733 Obstructive sleep apnea (adult) (pediatric): Secondary | ICD-10-CM | POA: Diagnosis not present

## 2024-07-12 DIAGNOSIS — G4733 Obstructive sleep apnea (adult) (pediatric): Secondary | ICD-10-CM | POA: Diagnosis not present
